# Patient Record
Sex: Female | Born: 1990 | Race: Black or African American | Hispanic: No | Marital: Single | Smoking: Never smoker
Health system: Southern US, Community
[De-identification: ages and names within clinical notes are randomized; demographics above are authoritative.]

## PROBLEM LIST (undated history)

## (undated) ENCOUNTER — Inpatient Hospital Stay: Payer: Self-pay

## (undated) DIAGNOSIS — D649 Anemia, unspecified: Secondary | ICD-10-CM

## (undated) DIAGNOSIS — L732 Hidradenitis suppurativa: Secondary | ICD-10-CM

## (undated) HISTORY — DX: Hidradenitis suppurativa: L73.2

## (undated) HISTORY — PX: OTHER SURGICAL HISTORY: SHX169

---
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
## 2011-10-24 ENCOUNTER — Emergency Department: Payer: Self-pay | Admitting: Emergency Medicine

## 2011-10-26 ENCOUNTER — Emergency Department: Payer: Self-pay | Admitting: Emergency Medicine

## 2012-10-19 ENCOUNTER — Emergency Department: Payer: Self-pay | Admitting: Emergency Medicine

## 2012-10-21 ENCOUNTER — Emergency Department: Payer: Self-pay | Admitting: Emergency Medicine

## 2012-11-26 ENCOUNTER — Emergency Department: Payer: Self-pay | Admitting: Emergency Medicine

## 2012-11-30 LAB — WOUND CULTURE

## 2013-09-22 ENCOUNTER — Emergency Department: Payer: Self-pay | Admitting: Emergency Medicine

## 2013-09-22 LAB — CBC
HCT: 36.7 % (ref 35.0–47.0)
HGB: 12.1 g/dL (ref 12.0–16.0)
MCH: 29.2 pg (ref 26.0–34.0)
MCHC: 32.9 g/dL (ref 32.0–36.0)
MCV: 89 fL (ref 80–100)
RBC: 4.13 10*6/uL (ref 3.80–5.20)
RDW: 14.2 % (ref 11.5–14.5)
WBC: 9.9 10*3/uL (ref 3.6–11.0)

## 2013-09-22 LAB — HCG, QUANTITATIVE, PREGNANCY: Beta Hcg, Quant.: <1 m[IU]/mL — ABNORMAL LOW

## 2013-11-03 ENCOUNTER — Emergency Department: Payer: Self-pay | Admitting: Emergency Medicine

## 2013-11-17 ENCOUNTER — Emergency Department: Payer: Self-pay | Admitting: Emergency Medicine

## 2013-12-07 ENCOUNTER — Observation Stay: Payer: Self-pay | Admitting: Surgery

## 2013-12-07 LAB — BASIC METABOLIC PANEL
ANION GAP: 7 (ref 7–16)
BUN: 6 mg/dL — ABNORMAL LOW (ref 7–18)
CHLORIDE: 101 mmol/L (ref 98–107)
Calcium, Total: 9.4 mg/dL (ref 8.5–10.1)
Co2: 25 mmol/L (ref 21–32)
Creatinine: 0.74 mg/dL (ref 0.60–1.30)
EGFR (African American): >60
EGFR (Non-African Amer.): >60
GLUCOSE: 109 mg/dL — AB (ref 65–99)
Osmolality: 265 (ref 275–301)
Potassium: 3.2 mmol/L — ABNORMAL LOW (ref 3.5–5.1)
Sodium: 133 mmol/L — ABNORMAL LOW (ref 136–145)

## 2013-12-07 LAB — CBC WITH DIFFERENTIAL/PLATELET
Basophil #: 0 10*3/uL (ref 0.0–0.1)
Basophil %: 0.2 %
EOS PCT: 0.5 %
Eosinophil #: 0.1 10*3/uL (ref 0.0–0.7)
HCT: 32.9 % — ABNORMAL LOW (ref 35.0–47.0)
HGB: 11.1 g/dL — AB (ref 12.0–16.0)
LYMPHS PCT: 4.2 %
Lymphocyte #: 0.7 10*3/uL — ABNORMAL LOW (ref 1.0–3.6)
MCH: 29.4 pg (ref 26.0–34.0)
MCHC: 33.7 g/dL (ref 32.0–36.0)
MCV: 87 fL (ref 80–100)
MONOS PCT: 3.9 %
Monocyte #: 0.6 x10 3/mm (ref 0.2–0.9)
NEUTROS ABS: 14.3 10*3/uL — AB (ref 1.4–6.5)
Neutrophil %: 91.2 %
PLATELETS: 336 10*3/uL (ref 150–440)
RBC: 3.77 10*6/uL — ABNORMAL LOW (ref 3.80–5.20)
RDW: 13.9 % (ref 11.5–14.5)
WBC: 15.7 10*3/uL — AB (ref 3.6–11.0)

## 2013-12-11 LAB — WOUND CULTURE

## 2013-12-26 DIAGNOSIS — L732 Hidradenitis suppurativa: Secondary | ICD-10-CM | POA: Insufficient documentation

## 2014-01-05 ENCOUNTER — Emergency Department: Payer: Self-pay | Admitting: Emergency Medicine

## 2014-01-05 LAB — BASIC METABOLIC PANEL
Anion Gap: 8 (ref 7–16)
BUN: 6 mg/dL — ABNORMAL LOW (ref 7–18)
CREATININE: 0.61 mg/dL (ref 0.60–1.30)
Calcium, Total: 9.2 mg/dL (ref 8.5–10.1)
Chloride: 103 mmol/L (ref 98–107)
Co2: 24 mmol/L (ref 21–32)
Glucose: 93 mg/dL (ref 65–99)
Osmolality: 267 (ref 275–301)
Potassium: 2.9 mmol/L — ABNORMAL LOW (ref 3.5–5.1)
Sodium: 135 mmol/L — ABNORMAL LOW (ref 136–145)

## 2014-01-05 LAB — URINALYSIS, COMPLETE
BILIRUBIN, UR: NEGATIVE
Blood: NEGATIVE
Glucose,UR: NEGATIVE mg/dL (ref 0–75)
Nitrite: NEGATIVE
Ph: 6 (ref 4.5–8.0)
SPECIFIC GRAVITY: 1.027 (ref 1.003–1.030)
WBC UR: 17 /HPF (ref 0–5)

## 2014-01-05 LAB — CBC
HCT: 34.8 % — AB (ref 35.0–47.0)
HGB: 11.5 g/dL — AB (ref 12.0–16.0)
MCH: 28.8 pg (ref 26.0–34.0)
MCHC: 33 g/dL (ref 32.0–36.0)
MCV: 87 fL (ref 80–100)
Platelet: 297 10*3/uL (ref 150–440)
RBC: 3.98 10*6/uL (ref 3.80–5.20)
RDW: 15 % — ABNORMAL HIGH (ref 11.5–14.5)
WBC: 7.3 10*3/uL (ref 3.6–11.0)

## 2014-01-13 ENCOUNTER — Emergency Department: Payer: Self-pay | Admitting: Emergency Medicine

## 2014-01-13 LAB — URINALYSIS, COMPLETE
BLOOD: NEGATIVE
Bilirubin,UR: NEGATIVE
Glucose,UR: NEGATIVE mg/dL (ref 0–75)
Nitrite: NEGATIVE
Ph: 6 (ref 4.5–8.0)
Protein: NEGATIVE
RBC,UR: 4 /HPF (ref 0–5)
Specific Gravity: 1.023 (ref 1.003–1.030)
Squamous Epithelial: 13
WBC UR: 21 /HPF (ref 0–5)

## 2014-01-13 LAB — COMPREHENSIVE METABOLIC PANEL
ALBUMIN: 3.3 g/dL — AB (ref 3.4–5.0)
ALT: 14 U/L (ref 12–78)
ANION GAP: 7 (ref 7–16)
AST: 12 U/L — AB (ref 15–37)
Alkaline Phosphatase: 62 U/L
BUN: 6 mg/dL — ABNORMAL LOW (ref 7–18)
Bilirubin,Total: 0.2 mg/dL (ref 0.2–1.0)
CALCIUM: 9.1 mg/dL (ref 8.5–10.1)
Chloride: 105 mmol/L (ref 98–107)
Co2: 25 mmol/L (ref 21–32)
Creatinine: 0.58 mg/dL — ABNORMAL LOW (ref 0.60–1.30)
EGFR (African American): >60
EGFR (Non-African Amer.): >60
Glucose: 73 mg/dL (ref 65–99)
Osmolality: 270 (ref 275–301)
Potassium: 3.3 mmol/L — ABNORMAL LOW (ref 3.5–5.1)
Sodium: 137 mmol/L (ref 136–145)
TOTAL PROTEIN: 7.5 g/dL (ref 6.4–8.2)

## 2014-01-13 LAB — CBC WITH DIFFERENTIAL/PLATELET
Basophil #: 0.1 10*3/uL (ref 0.0–0.1)
Basophil %: 0.6 %
Eosinophil #: 0.1 10*3/uL (ref 0.0–0.7)
Eosinophil %: 1.5 %
HCT: 33.8 % — ABNORMAL LOW (ref 35.0–47.0)
HGB: 11 g/dL — AB (ref 12.0–16.0)
Lymphocyte #: 1.6 10*3/uL (ref 1.0–3.6)
Lymphocyte %: 18.8 %
MCH: 28.4 pg (ref 26.0–34.0)
MCHC: 32.4 g/dL (ref 32.0–36.0)
MCV: 88 fL (ref 80–100)
Monocyte #: 0.5 x10 3/mm (ref 0.2–0.9)
Monocyte %: 6.2 %
NEUTROS ABS: 6.4 10*3/uL (ref 1.4–6.5)
Neutrophil %: 72.9 %
Platelet: 309 10*3/uL (ref 150–440)
RBC: 3.86 10*6/uL (ref 3.80–5.20)
RDW: 15 % — ABNORMAL HIGH (ref 11.5–14.5)
WBC: 8.7 10*3/uL (ref 3.6–11.0)

## 2014-01-13 LAB — HCG, QUANTITATIVE, PREGNANCY: BETA HCG, QUANT.: 17957 m[IU]/mL — AB

## 2014-05-16 ENCOUNTER — Ambulatory Visit: Payer: Self-pay | Admitting: Family Medicine

## 2014-05-27 ENCOUNTER — Observation Stay: Payer: Self-pay

## 2014-05-27 LAB — URINALYSIS, COMPLETE
BILIRUBIN, UR: NEGATIVE
BLOOD: NEGATIVE
Bacteria: NONE SEEN
Glucose,UR: NEGATIVE mg/dL (ref 0–75)
Ketone: NEGATIVE
Leukocyte Esterase: NEGATIVE
NITRITE: NEGATIVE
Ph: 7 (ref 4.5–8.0)
Protein: 30
RBC, UR: NONE SEEN /HPF (ref 0–5)
Specific Gravity: 1.021 (ref 1.003–1.030)
WBC UR: NONE SEEN /HPF (ref 0–5)

## 2014-05-28 LAB — CBC WITH DIFFERENTIAL/PLATELET
BASOS ABS: 0 10*3/uL (ref 0.0–0.1)
BASOS PCT: 0.2 %
EOS ABS: 0 10*3/uL (ref 0.0–0.7)
Eosinophil %: 0.6 %
HCT: 22.8 % — ABNORMAL LOW (ref 35.0–47.0)
HGB: 7.3 g/dL — AB (ref 12.0–16.0)
LYMPHS ABS: 1 10*3/uL (ref 1.0–3.6)
LYMPHS PCT: 14.5 %
MCH: 26.5 pg (ref 26.0–34.0)
MCHC: 31.8 g/dL — ABNORMAL LOW (ref 32.0–36.0)
MCV: 83 fL (ref 80–100)
Monocyte #: 0.8 x10 3/mm (ref 0.2–0.9)
Monocyte %: 11.2 %
NEUTROS ABS: 5.2 10*3/uL (ref 1.4–6.5)
NEUTROS PCT: 73.5 %
PLATELETS: 208 10*3/uL (ref 150–440)
RBC: 2.74 10*6/uL — AB (ref 3.80–5.20)
RDW: 15.7 % — AB (ref 11.5–14.5)
WBC: 7 10*3/uL (ref 3.6–11.0)

## 2014-05-28 LAB — COMPREHENSIVE METABOLIC PANEL
ALK PHOS: 129 U/L — AB
ANION GAP: 9 (ref 7–16)
Albumin: 2.2 g/dL — ABNORMAL LOW (ref 3.4–5.0)
BILIRUBIN TOTAL: 0.5 mg/dL (ref 0.2–1.0)
BUN: 4 mg/dL — ABNORMAL LOW (ref 7–18)
CHLORIDE: 108 mmol/L — AB (ref 98–107)
Calcium, Total: 7.8 mg/dL — ABNORMAL LOW (ref 8.5–10.1)
Co2: 24 mmol/L (ref 21–32)
Creatinine: 0.59 mg/dL — ABNORMAL LOW (ref 0.60–1.30)
Glucose: 86 mg/dL (ref 65–99)
Osmolality: 277 (ref 275–301)
POTASSIUM: 3.5 mmol/L (ref 3.5–5.1)
SGOT(AST): 13 U/L — ABNORMAL LOW (ref 15–37)
SGPT (ALT): 10 U/L — ABNORMAL LOW
SODIUM: 141 mmol/L (ref 136–145)
Total Protein: 6 g/dL — ABNORMAL LOW (ref 6.4–8.2)

## 2014-05-28 LAB — IRON AND TIBC
IRON: 47 ug/dL — AB (ref 50–170)
Iron Bind.Cap.(Total): 495 ug/dL — ABNORMAL HIGH (ref 250–450)
Iron Saturation: 9 %
Unbound Iron-Bind.Cap.: 448 ug/dL

## 2014-05-28 LAB — LIPASE, BLOOD: Lipase: 174 U/L (ref 73–393)

## 2014-05-30 ENCOUNTER — Observation Stay: Payer: Self-pay

## 2014-05-30 LAB — CBC WITH DIFFERENTIAL/PLATELET
Basophil #: 0 10*3/uL (ref 0.0–0.1)
Basophil %: 0.1 %
Eosinophil #: 0 10*3/uL (ref 0.0–0.7)
Eosinophil %: 0.2 %
HCT: 27.7 % — AB (ref 35.0–47.0)
HGB: 9.1 g/dL — ABNORMAL LOW (ref 12.0–16.0)
LYMPHS ABS: 1.3 10*3/uL (ref 1.0–3.6)
Lymphocyte %: 10.3 %
MCH: 27.2 pg (ref 26.0–34.0)
MCHC: 32.7 g/dL (ref 32.0–36.0)
MCV: 83 fL (ref 80–100)
Monocyte #: 0.6 x10 3/mm (ref 0.2–0.9)
Monocyte %: 4.5 %
Neutrophil #: 10.6 10*3/uL — ABNORMAL HIGH (ref 1.4–6.5)
Neutrophil %: 84.9 %
PLATELETS: 233 10*3/uL (ref 150–440)
RBC: 3.33 10*6/uL — ABNORMAL LOW (ref 3.80–5.20)
RDW: 15.9 % — ABNORMAL HIGH (ref 11.5–14.5)
WBC: 12.5 10*3/uL — AB (ref 3.6–11.0)

## 2014-05-30 LAB — URINALYSIS, COMPLETE
Bacteria: NONE SEEN
Bilirubin,UR: NEGATIVE
Blood: NEGATIVE
GLUCOSE, UR: NEGATIVE mg/dL (ref 0–75)
KETONE: NEGATIVE
LEUKOCYTE ESTERASE: NEGATIVE
Nitrite: NEGATIVE
Ph: 7 (ref 4.5–8.0)
Protein: NEGATIVE
RBC,UR: 4 /HPF (ref 0–5)
SPECIFIC GRAVITY: 1.016 (ref 1.003–1.030)
WBC UR: 3 /HPF (ref 0–5)

## 2014-06-16 ENCOUNTER — Ambulatory Visit: Payer: Self-pay | Admitting: Family Medicine

## 2014-06-22 ENCOUNTER — Observation Stay: Payer: Self-pay

## 2014-06-24 ENCOUNTER — Inpatient Hospital Stay: Payer: Self-pay

## 2014-06-24 LAB — CBC WITH DIFFERENTIAL/PLATELET
BASOS ABS: 0 10*3/uL (ref 0.0–0.1)
Basophil %: 0.2 %
Eosinophil #: 0.1 10*3/uL (ref 0.0–0.7)
Eosinophil %: 0.9 %
HCT: 29 % — ABNORMAL LOW (ref 35.0–47.0)
HGB: 9.2 g/dL — AB (ref 12.0–16.0)
LYMPHS PCT: 12.3 %
Lymphocyte #: 1.4 10*3/uL (ref 1.0–3.6)
MCH: 27.5 pg (ref 26.0–34.0)
MCHC: 31.6 g/dL — ABNORMAL LOW (ref 32.0–36.0)
MCV: 87 fL (ref 80–100)
MONO ABS: 0.5 x10 3/mm (ref 0.2–0.9)
Monocyte %: 4.4 %
Neutrophil #: 9.5 10*3/uL — ABNORMAL HIGH (ref 1.4–6.5)
Neutrophil %: 82.2 %
Platelet: 229 10*3/uL (ref 150–440)
RBC: 3.34 10*6/uL — AB (ref 3.80–5.20)
RDW: 19.4 % — ABNORMAL HIGH (ref 11.5–14.5)
WBC: 11.6 10*3/uL — ABNORMAL HIGH (ref 3.6–11.0)

## 2014-06-24 LAB — DRUG SCREEN, URINE
AMPHETAMINES, UR SCREEN: NEGATIVE (ref ?–1000)
BARBITURATES, UR SCREEN: NEGATIVE (ref ?–200)
Benzodiazepine, Ur Scrn: NEGATIVE (ref ?–200)
Cannabinoid 50 Ng, Ur ~~LOC~~: POSITIVE (ref ?–50)
Cocaine Metabolite,Ur ~~LOC~~: NEGATIVE (ref ?–300)
MDMA (ECSTASY) UR SCREEN: NEGATIVE (ref ?–500)
Methadone, Ur Screen: NEGATIVE (ref ?–300)
Opiate, Ur Screen: NEGATIVE (ref ?–300)
Phencyclidine (PCP) Ur S: NEGATIVE (ref ?–25)
Tricyclic, Ur Screen: NEGATIVE (ref ?–1000)

## 2014-06-25 LAB — GC/CHLAMYDIA PROBE AMP

## 2014-06-26 LAB — HEMATOCRIT: HCT: 27.6 % — AB (ref 35.0–47.0)

## 2014-11-16 HISTORY — PX: INCISION AND DRAINAGE BREAST ABSCESS: SUR672

## 2015-02-03 IMAGING — US US OB LIMITED
1 series · 14 of 28 positions shown · non-contrast
Comparison: none

CLINICAL DATA: Light vaginal bleeding.

EXAM:
LIMITED OBSTETRIC ULTRASOUND

[Series 1: us ob limited · 0.18mm/px · 36 acquisitions, 14 frames shown]
[im 2/36]
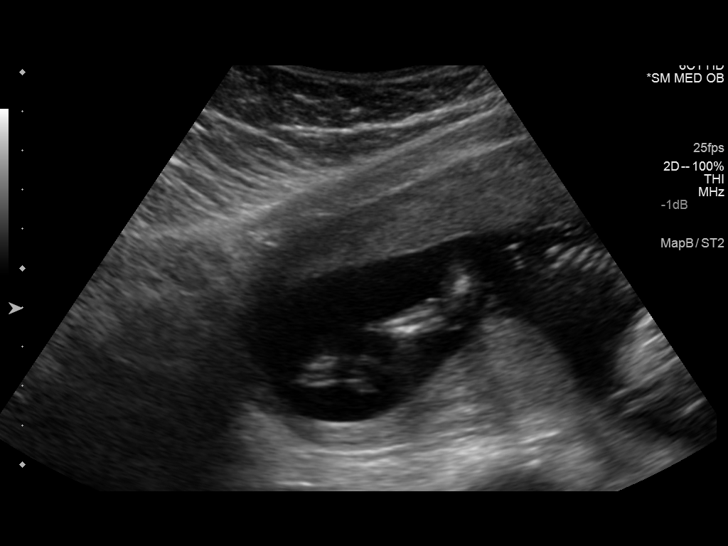
[im 4/36]
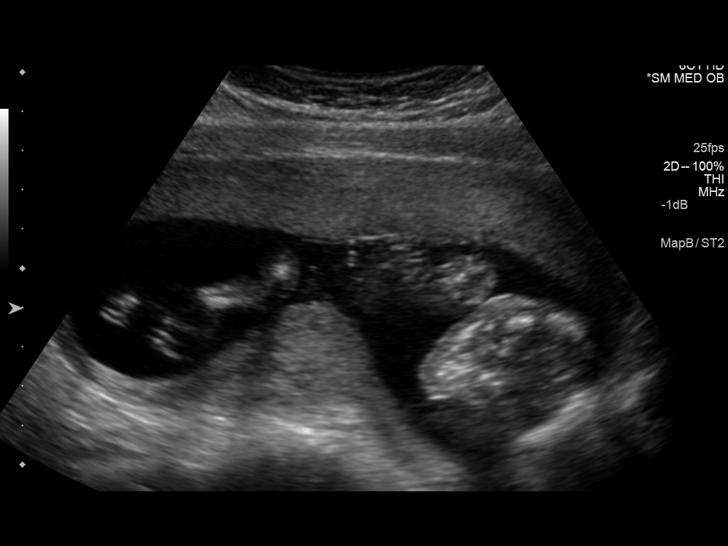
[im 7/36]
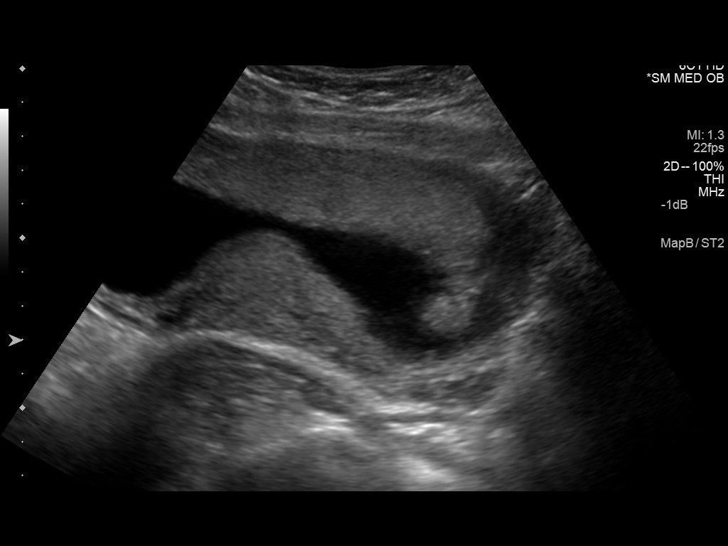
[im 10/36]
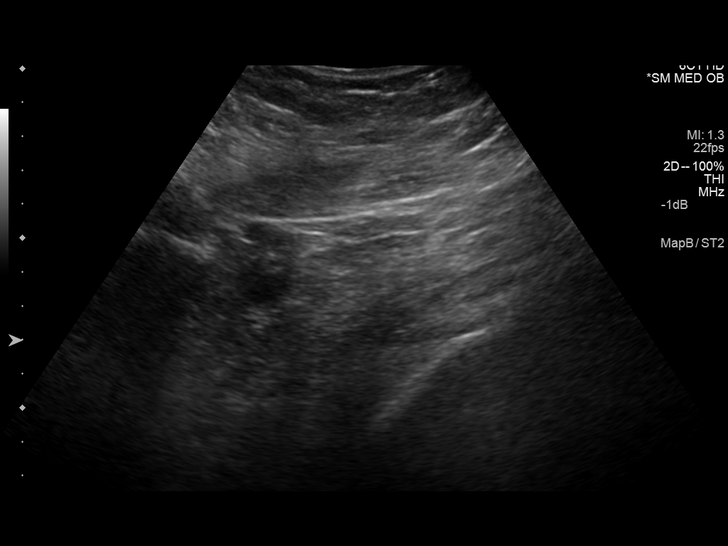
[im 12/36]
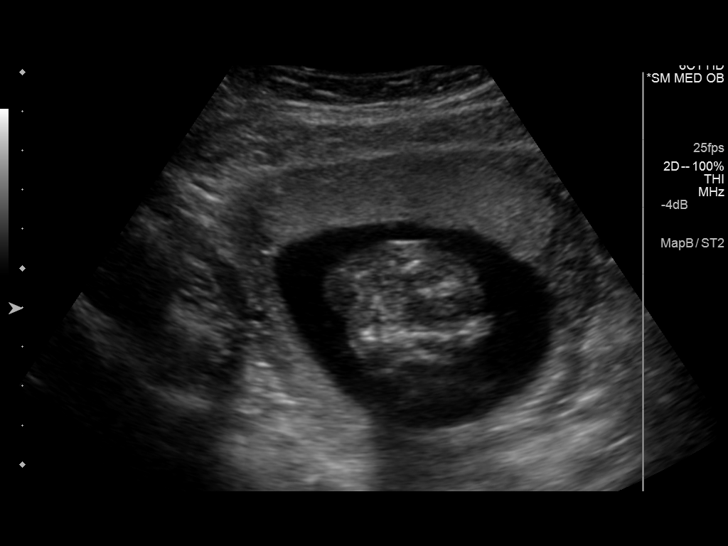
[im 15/36]
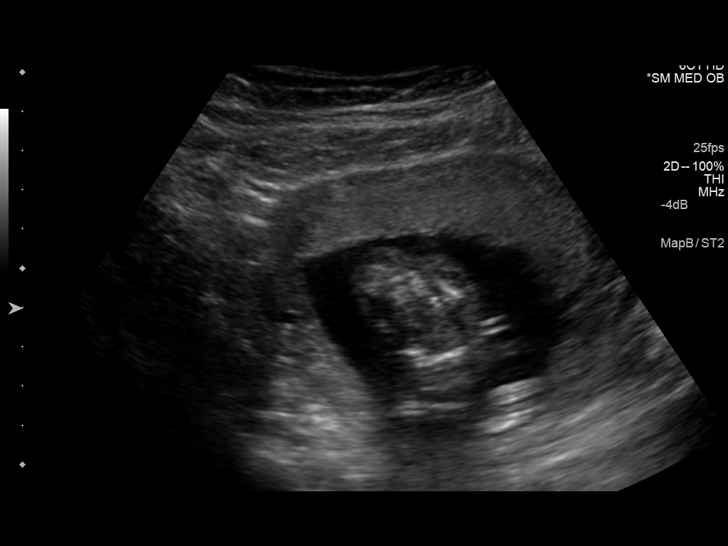
[im 17/36]
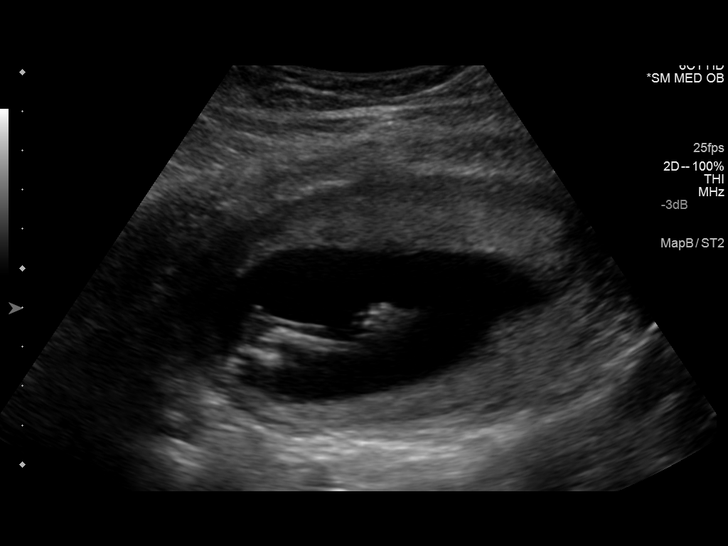
[im 20/36]
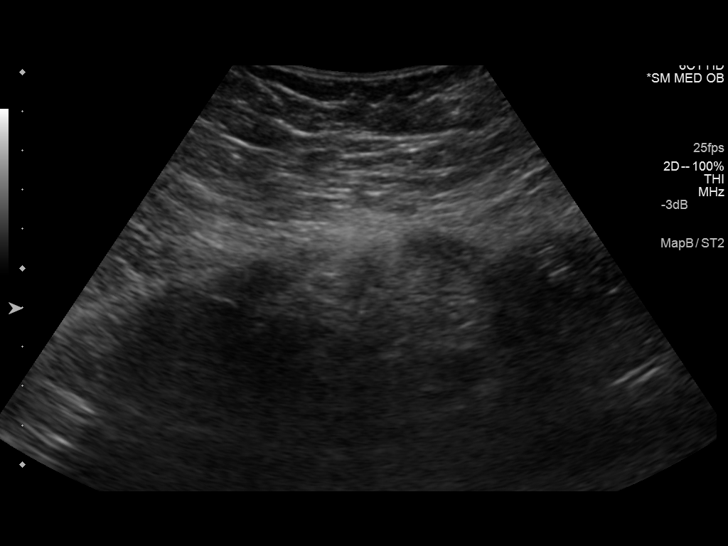
[im 23/36]
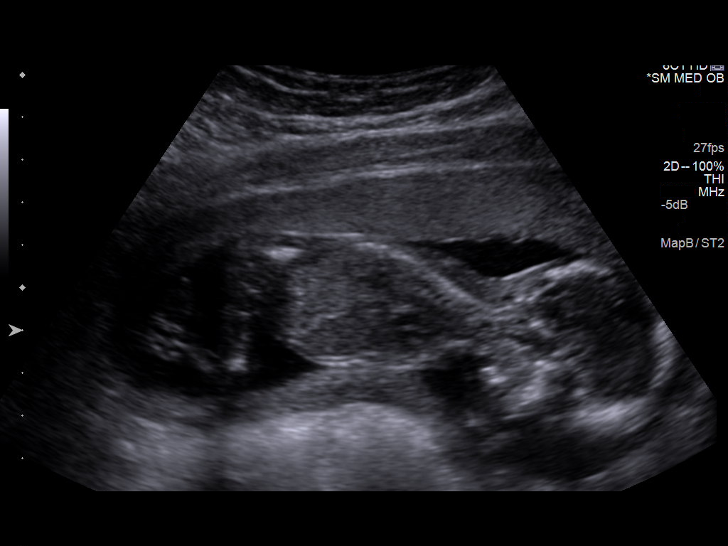
[im 25/36]
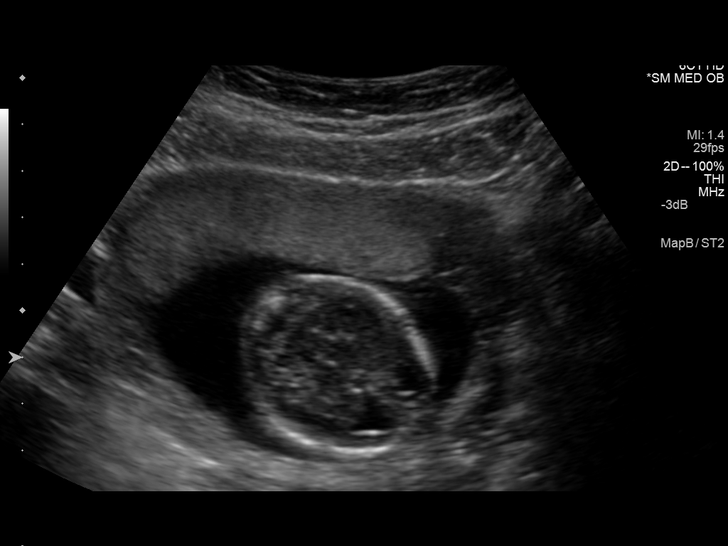
[im 28/36]
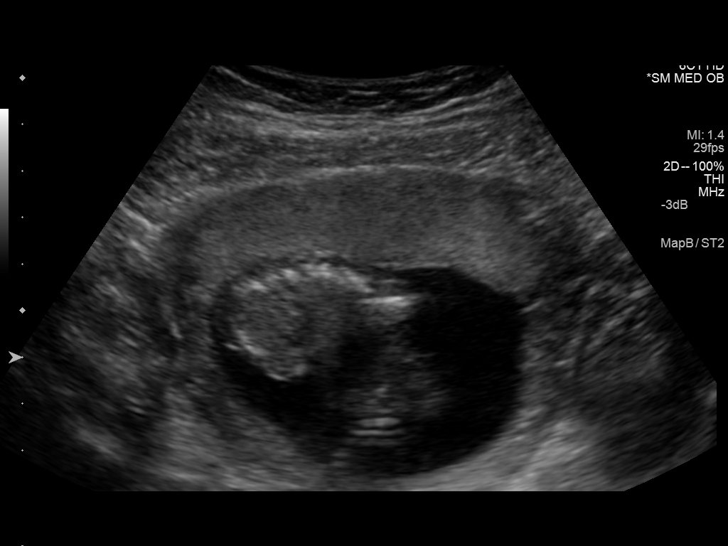
[im 30/36]
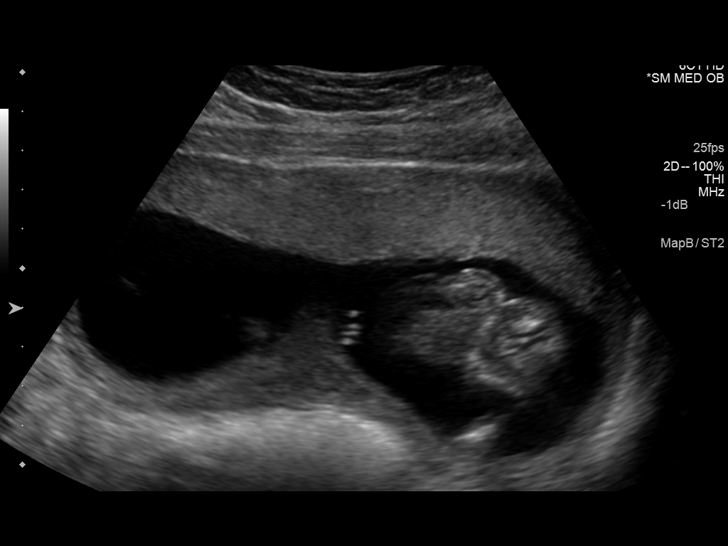
[im 33/36]
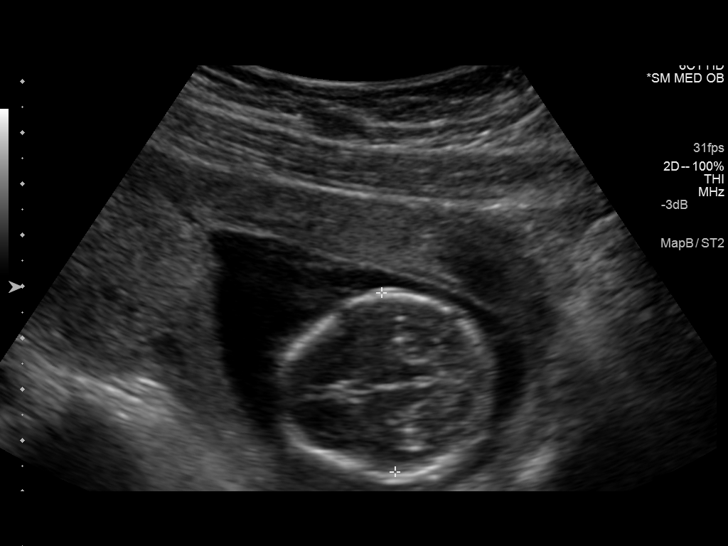
[im 36/36]
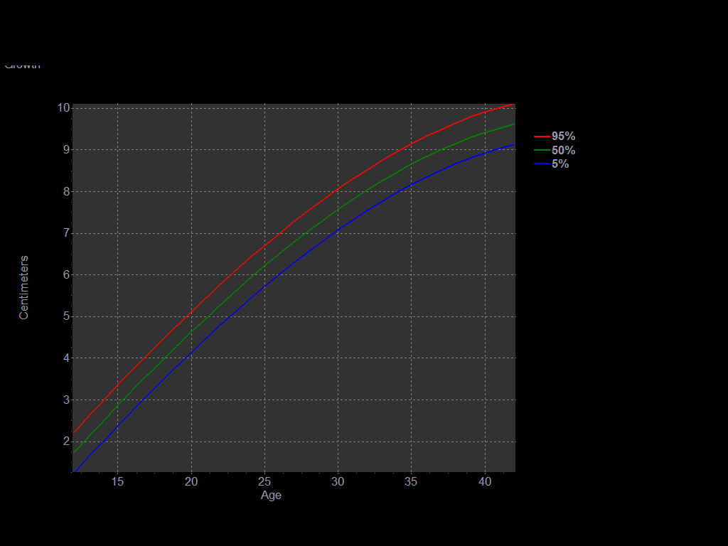

[14 of 28 positions shown; findings below may reference images not displayed]

FINDINGS: Number of Fetuses: 1

Heart Rate:  150 bpm

Movement: Yes

Presentation: Cephalic

Placental Location: Anterior

Previa: No

Amniotic Fluid (Subjective):  Within normal limits.

BPD:  3.49cm 16w  5d

MATERNAL FINDINGS:

Cervix:  Appears closed.

Uterus/Adnexae:  No abnormality visualized.
IMPRESSION: 1. Single viable IUP with estimated gestational age of 16 weeks and
5 days. Fetal heart rate is normal at 150 beats per min. Amniotic
fluid appears within normal limits. No acute findings.

This exam is performed on an emergent basis and does not
comprehensively evaluate fetal size, dating, or anatomy; follow-up
complete OB US should be considered if further fetal assessment is
warranted.

## 2015-02-06 NOTE — H&P (Signed)
   Subjective/Chief Complaint Left breast swelling/pain/erythema   History of Present Illness [name] is a pleasant 24 yo F who recently had both nipples pierced approx 2-3 weeks ago.  Took out rings then noticed increased pain/redness and swelling over the past 1 week.  Was given rx for bactrim but did not take as she has not eaten for 3 days.  + subjective fevers, malaise   Past Med/Surgical Hx:  Denies medical history:   Denies surgical history.:   ALLERGIES:  No Known Allergies:   Family and Social History:  Family History Cancer   Social History negative tobacco, negative ETOH   Place of Living [place]   Review of Systems:  Subjective/Chief Complaint Left breast swelling/redness/pain   Fever/Chills No   Cough No   Sputum No   Abdominal Pain No   Diarrhea No   Constipation No   Nausea/Vomiting Yes   SOB/DOE No   Chest Pain No   Dysuria No   Tolerating Diet No   Physical Exam:  GEN well developed, no acute distress   HEENT pink conjunctivae, PERRL, good dentition   RESP normal resp effort  clear BS   CARD regular rate  no murmur  no thrills   ABD denies tenderness  denies Flank Tenderness  positive hernia  soft  normal BS   SKIN Left breast with swelling, tenderness, erythema, approx 4 x 4 cm hard mass medial left breast   NEURO cranial nerves intact, negative rigidity, negative tremor, motor/sensory function intact   PSYCH A+O to time, place, person, good insight    Assessment/Admission Diagnosis [name] is a pleasant 24 yo F with left breast swelling/erythema/induration/pain.  Clinically I feel that she has a significant breast abscess.   Plan Admit, IV abx, plan on OR for aspiration vs I and D of breast abscess.   Electronic Signatures: Jarvis NewcomerLundquist, Ithan Touhey A (MD)  (Signed [phone]-Feb-15 15:10)  Authored: CHIEF COMPLAINT and HISTORY, PAST MEDICAL/SURGIAL HISTORY, ALLERGIES, FAMILY AND SOCIAL HISTORY, REVIEW OF SYSTEMS, PHYSICAL EXAM,  ASSESSMENT AND PLAN   Last Updated: 22-Feb-15 15:10 by Jarvis NewcomerLundquist, Alisabeth Selkirk A (MD)

## 2015-02-06 NOTE — Discharge Summary (Signed)
PATIENT NAME:  Joyce [name], Joyce A MR#:  [id number] DATE OF BIRTH:  06-17-1991  DATE OF ADMISSION:  12/07/2013 DATE OF DISCHARGE:  12/09/2013  FINAL DIAGNOSIS: Left breast abscess.   PROCEDURE PERFORMED: Left breast incision and drainage.   HOSPITAL COURSE SUMMARY: The patient was admitted on the 22nd. She was actually found to be pregnant. She was taken to the operating room on the 22nd of February and under local anesthesia with IV narcotics, the patient had incision and drainage of left breast abscess performed by Dr. Juliann PulseLundquist with Penrose drain placement. On postoperative day #1, the patient had some residual cellulitis and moderate drainage with adequate pain control. On postoperative day #2, the patient was doing much better and was deemed suitable for discharge with close followup in the office. Penrose drain will be left in place.   DISCHARGE MEDICATIONS: Cleocin 300 mg by mouth every 6 hours, Zofran 4 mg by mouth every 8 hours as needed for nausea and vomiting, acetaminophen/hydrocodone 5/325 one tab every 4 hours as needed for pain.   She was to follow up in the office this coming Friday for drain removal and followup.  ____________________________ Redge GainerMark A. Egbert GaribaldiBird, MD mab:jcm D: 12/22/2013 20:20:10 ET T: 12/22/2013 22:43:32 ET JOB#: [id number]  cc: Loraine LericheMark A. Egbert GaribaldiBird, MD, <Dictator> [place] Yearby A Cyera Balboni MD ELECTRONICALLY SIGNED 12/23/2013 0:40

## 2015-02-06 NOTE — Op Note (Signed)
PATIENT NAME:  Joyce [name], [name] MR#:  [id number] DATE OF BIRTH:  30-May-1991  DATE OF PROCEDURE:  06/25/2014  PREOPERATIVE DIAGNOSIS:  Term intrauterine pregnancy, failure to progress.   POSTOPERATIVE DIAGNOSIS:  Term intrauterine pregnancy, failure to progress.   PROCEDURE PERFORMED:  Low transverse cesarean section, placement of On-Q pain pump.   SURGEON:  Dierdre Searles. Paul Derius Ghosh, MD   ANESTHESIA:  Spinal.   ESTIMATED BLOOD LOSS:  250 mL.   COMPLICATIONS:  None.   FINDINGS:  Normal tubes, ovaries, and uterus; viable female infant weighing 9 pounds 2 ounces with Apgar scores of 8 and 9 at 1 and 5 minutes respectively.   DISPOSITION:  To the recovery room in stable condition.   TECHNIQUE:  The patient was prepped and draped in the usual sterile fashion after adequate anesthesia was obtained in the supine position on the operating room table. A scalpel was used to create a low transverse skin incision that was then dissected down to the level of the rectus fascia, which was dissected bilaterally using Mayo scissors. The rectus muscles were separated in the midline, the peritoneum was penetrated, and the bladder was inferiorly retracted and dissected. A scalpel was used to create a low transverse hysterotomy incision that was then extended by blunt dissection with amniotomy revealing clear fluid. The infant's head was grasped and delivered without complication, and no nuchal cord was noted. The infant was then delivered completely with clamping and cutting of the umbilical cord.   The placenta was extracted and the uterus was externalized and cleansed of all membranes and debris using a moist sponge. The hysterotomy incision was closed with running 1 Vicryl suture in a locking fashion followed by a second layer to imbricate the first layer with excellent hemostasis noted. The uterus was placed back in the intra-abdominal cavity, and the paracolic gutters were irrigated using warm saline. Re-examination of  the incision revealed excellent hemostasis. The peritoneum was closed with 0 Vicryl suture.   Trocars were placed in the abdomen into the subfascial space, where silver soaker catheters were placed without difficulty. The fascia was closed with 0 Maxon suture, subcutaneous tissues were irrigated, and hemostasis was assured using electrocautery. The skin was closed with a 4-0 Vicryl suture in a subcuticular fashion followed by placement of Dermabond. The catheters were flushed with 5 mL each of bupivacaine and stabilized in place with a Tegaderm bandage. The patient went to the recovery room in stable condition. All sponge, instrument, and needle counts were correct.    ____________________________ R. Annamarie MajorPaul Erricka Falkner, MD rph:nb D: 06/25/2014 22:08:48 ET T: 06/26/2014 00:04:09 ET JOB#: [id number]  cc: Dierdre Searles. Paul Tylan Briguglio, MD, <Dictator> Nadara MustardOBERT P Kassem Kibbe MD ELECTRONICALLY SIGNED 07/02/2014 13:03

## 2015-02-06 NOTE — Op Note (Signed)
PATIENT NAME:  Joyce [name], Joyce [name] MR#:  [id number] DATE OF BIRTH:  1991-07-24  DATE OF PROCEDURE:  12/07/2013  PREOPERATIVE DIAGNOSIS: Left breast abscess.  POSTOPERATIVE DIAGNOSIS:  Left breast abscess.  Measurement of total area 11 x 8 x 2 cm.   PROCEDURE PERFORMED: Incision and drainage of left breast abscess and placement of Penrose drain.   ANESTHESIA: IV narcotic and approximately 3 mL of 1% lidocaine.   ESTIMATED BLOOD LOSS:  10 mL   COMPLICATIONS: None.   INDICATION FOR SURGERY:  [name] is [name] pleasant 24 year old female who presents with left breast pain, swelling and erythema.  It was might thought that she had an abscess and was in need of surgical treatment due to the size of the abscess and the decision was made to perform it in operating room with IV narcotics.     DETAILS OF PROCEDURE: Informed consent was obtained.  [name] was brought to the operating room suite.  Of note, with informed consent, Joyce [name] was discovered to have [name] positive pregnancy test.  Her last menstrual cycle ended on December 6, so the thought was that she was approximately eight weeks gestation. Initial plan was for general anesthesia due to size. I still thought she would benefit from drainage but the decision was made to this almost exclusively on IV narcotics. She was then laid supine on the operating room table. She was given IV narcotics to provide her with some pain control. Her left breast was then prepped and draped in standard sterile fashion.  [name] timeout was then performed, correctly identifying the patient name, operative site and procedure to be performed. I then used approximately 3 mL of 1% lidocaine to infiltrate the skin above the most fluctuant area on her breast on her medial areola.  I then made [name] small incision.  There was [name] large amount purulence. I then irrigated the cavity and probed it with [name] hemostat. I made [name] counterincision inferiorly on the nipple and then placed [name] penrose  drain to keep the cavity open. I then packed with quarter-inch gauze to provide hemostasis.  Sterile dressing was then placed over the wound. The patient was then awoken, extubated and brought to the postanesthesia care unit. There were no immediate complications. Needle, sponge, and instrument count was correct at the end of the procedure.    ____________________________ Si Raiderhristopher [name]. Shiniqua Groseclose, MD cal:dp D: 12/09/2013 07:43:00 ET T: 12/09/2013 09:35:08 ET JOB#: [id number]  cc: Cristal Deerhristopher [name]. Taite Baldassari, MD, <Dictator> Jarvis NewcomerHRISTOPHER [name] Trinia Georgi MD ELECTRONICALLY SIGNED 12/10/2013 7:55

## 2015-02-23 NOTE — H&P (Signed)
L&D Evaluation:  History:  HPI 24 year old 684 51P1021 with EDC=06/17/2014  by a 16wk5day BPD presents at 37 weeks with c/o sudden onset of nausea/vomiting at 4 AM this morning, followed by diarrhea. Has vomited about 3 times since then, the last time around 12 noon. Has had 6 watery stools since then also. Denies blood in vomitus or stools. Woke up sweating, but unsure if she had a fever. Also c/o right side pain extending to right siide of lower back. Has had increased urinary frequency x 2 weeks, but no dysuria. Baby active. Has tried to eat applesauce and drink water today. Last ate and kept down [employer] at 7 PM last night. No one else who ate with her has had similar sx. [facility] late onset careat 20 weeks, depression (was on Zoloft for a short time and [facility]), a normal anatomy scan, and normal growth scan at 34 weeks (57th%), anemia, MJ use, and Trichimonas vaginitis. AB POS, VI, RI   Presents with back pain, nausea/vomiting, and diarrhea   Patient's Medical History Depression   Patient's Surgical History I&D left breast abscess   Medications Pre Natal Vitamins   Allergies NKDA   Social History drugs  MJ   Family History Non-Contributory   ROS:  ROS see HPI   Exam:  Vital Signs 126/67   Urine Protein not completed   General no apparent distress   Mental Status clear   Chest clear   Heart normal sinus rhythm, no murmur/gallop/rubs   Abdomen gravid, non-tender, BS active   Back possible right CVAT   FHT normal rate with no decels, 140s with accels to 160s to 170   FHT Description Cat 1   Ucx uterine irritability only   Skin dry   Other Mouth: thick sputum, no OP inflammation   Impression:  Impression reactive NST, IUP at 37 weeks with acute gastroenteritis   Plan:  Plan UA, IV fluids and IV antiemetics. NPO x sips water until 4 PM then try increasing clear liquids.   Electronic  Signatures: Trinna BalloonGutierrez, Jewelianna Pancoast L (CNM)  (Signed 12-Aug-15 14:33)  Authored: L&D Evaluation   Last Updated: 12-Aug-15 14:33 by Trinna BalloonGutierrez, Moneisha Vosler L (CNM)
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
## 2015-02-23 NOTE — H&P (Signed)
L&D Evaluation:  History:  HPI 24 year old G4 32P1021 with EDC=06/17/2014  by a 16wk5day BPD presents at 40 5/7 weeks for a NST and AFI. Baby has been active. Having some cramping and loose stools today, and a little nausea this AM. [facility] at 20 weeks, depression (was on Zoloft for a short time and was [facility]), a normal anatomy scan, and normal growth scan at 34 weeks (57th%), anemia (given a unit of PRBCs and IV Fe 3 weeks ago-is not taking Fe/vitamins due to constipation), MJ use, and Trichimonas vaginitis. AB POS, VI, RI, GBS positive. Received TDAP 04/15/2014   Presents with for NST/AFI   Patient's Medical History Depression   Patient's Surgical History I&D left breast abscess   Medications Pre Natal Vitamins  Iron  has not taken in a while   Allergies NKDA   Social History drugs  MJ   Family History Non-Contributory   ROS:  ROS see HPI   Exam:  Vital Signs stable  101/53   General no apparent distress   Mental Status clear   Chest clear   Heart normal sinus rhythm, no murmur/gallop/rubs   Abdomen gravid, non-tender   Estimated Fetal Weight 7-14   Fetal Position cephalic   Reflexes 1+   Pelvic no external lesions, 2/50%/-2   Mebranes Intact   FHT normal rate with no decels, 125 baseline with accels to 150s to 160. (Four 15x15 accels in 28 minutes   FHT Description Cat 1   Ucx q2-6 min, mild   Skin dry   Other AFI=1.10 cm + 2.10+ 5.64cm+3.47=12.3 cm   Impression:  Impression IUP at 40 5/7 weeks with reactive NST and  normal AFI   Plan:  Plan DC home with labor precautions   Comments Scheduled for IOL 9 Sept PM at 2000 if NIL. Discussed using Cervidil for ripening purposes Wednesday nite. Will also need GBS prophyllaxis.   Electronic Signatures: Trinna BalloonGutierrez, Hrishikesh Hoeg L (CNM)  (Signed 07-Sep-15 17:22)  Authored: L&D Evaluation   Last Updated: 07-Sep-15 17:22 by Trinna BalloonGutierrez,  Giabella Duhart L (CNM)

## 2015-02-25 ENCOUNTER — Emergency Department
Admission: EM | Admit: 2015-02-25 | Discharge: 2015-02-25 | Disposition: A | Discharge status: Home / Self Care | Payer: Medicaid Other | Attending: Emergency Medicine | Admitting: Emergency Medicine

## 2015-02-25 ENCOUNTER — Encounter: Payer: Self-pay | Admitting: *Deleted

## 2015-02-25 DIAGNOSIS — A400 Sepsis due to streptococcus, group A: Secondary | ICD-10-CM | POA: Diagnosis not present

## 2015-02-25 DIAGNOSIS — J020 Streptococcal pharyngitis: Secondary | ICD-10-CM | POA: Insufficient documentation

## 2015-02-25 DIAGNOSIS — D649 Anemia, unspecified: Secondary | ICD-10-CM | POA: Insufficient documentation

## 2015-02-25 DIAGNOSIS — J029 Acute pharyngitis, unspecified: Secondary | ICD-10-CM | POA: Diagnosis present

## 2015-02-25 HISTORY — DX: Anemia, unspecified: D64.9

## 2015-02-25 LAB — URINALYSIS COMPLETE WITH MICROSCOPIC (ARMC ONLY)
Bilirubin Urine: NEGATIVE
GLUCOSE, UA: NEGATIVE mg/dL
Leukocytes, UA: NEGATIVE
Nitrite: NEGATIVE
Protein, ur: 100 mg/dL — AB
SPECIFIC GRAVITY, URINE: 1.027 (ref 1.005–1.030)
pH: 6 (ref 5.0–8.0)

## 2015-02-25 LAB — CBC WITH DIFFERENTIAL/PLATELET
BASOS PCT: 0 %
Basophils Absolute: 0.1 10*3/uL (ref 0–0.1)
Eosinophils Absolute: 0 10*3/uL (ref 0–0.7)
Eosinophils Relative: 0 %
HEMATOCRIT: 37.8 % (ref 35.0–47.0)
HEMOGLOBIN: 12 g/dL (ref 12.0–16.0)
Lymphocytes Relative: 5 %
Lymphs Abs: 1.1 10*3/uL (ref 1.0–3.6)
MCH: 27.6 pg (ref 26.0–34.0)
MCHC: 31.8 g/dL — AB (ref 32.0–36.0)
MCV: 86.9 fL (ref 80.0–100.0)
MONOS PCT: 6 %
Monocytes Absolute: 1.1 10*3/uL — ABNORMAL HIGH (ref 0.2–0.9)
NEUTROS ABS: 17.9 10*3/uL — AB (ref 1.4–6.5)
NEUTROS PCT: 89 %
Platelets: 291 10*3/uL (ref 150–440)
RBC: 4.35 MIL/uL (ref 3.80–5.20)
RDW: 14.9 % — ABNORMAL HIGH (ref 11.5–14.5)
WBC: 20.2 10*3/uL — ABNORMAL HIGH (ref 3.6–11.0)

## 2015-02-25 LAB — BASIC METABOLIC PANEL
Anion gap: 10 (ref 5–15)
BUN: 6 mg/dL (ref 6–20)
CO2: 25 mmol/L (ref 22–32)
Calcium: 8.8 mg/dL — ABNORMAL LOW (ref 8.9–10.3)
Chloride: 103 mmol/L (ref 101–111)
Creatinine, Ser: 0.78 mg/dL (ref 0.44–1.00)
GFR calc Af Amer: >60 mL/min (ref >60)
Glucose, Bld: 98 mg/dL (ref 65–99)
Potassium: 3.5 mmol/L (ref 3.5–5.1)
Sodium: 138 mmol/L (ref 135–145)

## 2015-02-25 LAB — POCT RAPID STREP A: Streptococcus, Group A Screen (Direct): NEGATIVE

## 2015-02-25 MED ORDER — PENICILLIN G BENZATHINE 1200000 UNIT/2ML IM SUSP
1.2000 10*6.[IU] | Freq: Once | INTRAMUSCULAR | Status: AC
  Administered 2015-02-25: 1.2 10*6.[IU] via INTRAMUSCULAR
  Filled 2015-02-25: qty 2

## 2015-02-25 MED ORDER — ACETAMINOPHEN 500 MG PO TABS: 1000.0000 mg | ORAL_TABLET | Freq: Once | ORAL | Status: DC

## 2015-02-25 MED ORDER — ACETAMINOPHEN 500 MG PO TABS
ORAL_TABLET | ORAL | Status: AC
  Filled 2015-02-25: qty 3

## 2015-02-25 MED ORDER — PENICILLIN G PROCAINE 600000 UNIT/ML IM SUSP
INTRAMUSCULAR | Status: AC
  Administered 2015-02-25: 0.6 10*6.[IU]
  Filled 2015-02-25: qty 2

## 2015-02-25 MED ORDER — ONDANSETRON HCL 4 MG PO TABS: 4.0000 mg | ORAL_TABLET | Freq: Four times a day (QID) | ORAL | Status: DC | PRN

## 2015-02-25 MED ORDER — SODIUM CHLORIDE 0.9 % IV BOLUS (SEPSIS)
1000.0000 mL | Freq: Once | INTRAVENOUS | Status: AC
  Administered 2015-02-25: 1000 mL via INTRAVENOUS

## 2015-02-25 MED ORDER — ONDANSETRON HCL 4 MG/2ML IJ SOLN
INTRAMUSCULAR | Status: AC
  Administered 2015-02-25: 4 mg via INTRAVENOUS
  Filled 2015-02-25: qty 2

## 2015-02-25 MED ORDER — KETOROLAC TROMETHAMINE 30 MG/ML IJ SOLN
INTRAMUSCULAR | Status: AC
  Administered 2015-02-25: 30 mg
  Filled 2015-02-25: qty 6

## 2015-02-25 NOTE — ED Notes (Signed)
x3 days , sore throat, headache , earache, difficulty swallowing " because of the soreness"

## 2015-02-25 NOTE — Discharge Instructions (Signed)
Pharyngitis Pharyngitis is redness, pain, and swelling (inflammation) of your pharynx.  CAUSES  Pharyngitis is usually caused by infection. Most of the time, these infections are from viruses (viral) and are part of a cold. However, sometimes pharyngitis is caused by bacteria (bacterial). Pharyngitis can also be caused by allergies. Viral pharyngitis may be spread from person to person by coughing, sneezing, and personal items or utensils (cups, forks, spoons, toothbrushes). Bacterial pharyngitis may be spread from person to person by more intimate contact, such as kissing.  SIGNS AND SYMPTOMS  Symptoms of pharyngitis include:   Sore throat.   Tiredness (fatigue).   Low-grade fever.   Headache.  Joint pain and muscle aches.  Skin rashes.  Swollen lymph nodes.  Plaque-like film on throat or tonsils (often seen with bacterial pharyngitis). DIAGNOSIS  Your health care provider will ask you questions about your illness and your symptoms. Your medical history, along with a physical exam, is often all that is needed to diagnose pharyngitis. Sometimes, a rapid strep test is done. Other lab tests may also be done, depending on the suspected cause.  TREATMENT  Viral pharyngitis will usually get better in 3-4 days without the use of medicine. Bacterial pharyngitis is treated with medicines that kill germs (antibiotics).  HOME CARE INSTRUCTIONS   Drink enough water and fluids to keep your urine clear or pale yellow.   Only take over-the-counter or prescription medicines as directed by your health care provider:   If you are prescribed antibiotics, make sure you finish them even if you start to feel better.   Do not take aspirin.   Get lots of rest.   Gargle with 8 oz of salt water ( tsp of salt per 1 qt of water) as often as every 1-2 hours to soothe your throat.   Throat lozenges (if you are not at risk for choking) or sprays may be used to soothe your throat. SEEK MEDICAL  CARE IF:   You have large, tender lumps in your neck.  You have a rash.  You cough up green, yellow-brown, or bloody spit. SEEK IMMEDIATE MEDICAL CARE IF:   Your neck becomes stiff.  You drool or are unable to swallow liquids.  You vomit or are unable to keep medicines or liquids down.  You have severe pain that does not go away with the use of recommended medicines.  You have trouble breathing (not caused by a stuffy nose). MAKE SURE YOU:   Understand these instructions.  Will watch your condition.  Will get help right away if you are not doing well or get worse. Document Released: 10/02/2005 Document Revised: 07/23/2013 Document Reviewed: 06/09/2013 [facility] Patient Information 2015 [place]. This information is not intended to replace advice given to you by your health care provider. Make sure you discuss any questions you have with your health care provider.  You have been treated for an acute infection in the throat which seems to have caused you to become septic. Sepsis includes your symptoms of fever, chills, nausea, weakness, and dehydration. Although your rapid strep test was negative, you have been treated with the expectation that your throat culture will be positive. You may receive a call with that result tomorrow.  Rest, hydrate, and take Tylenol and Motrin for pain and fevers. Take the medicine for nausea as needed, increase fluid intake to prevent dehydration.  Follow-up with your provider, or return to the ED immediately for worsening fevers, nausea, pain, or decreased urination.

## 2015-02-25 NOTE — ED Notes (Signed)
States she is having h/a   Sore throat and body aches for several days.

## 2015-02-25 NOTE — ED Provider Notes (Signed)
[facility] Emergency Department Provider Note? ____________________________________________ ? Time seen: 1332 ? I have reviewed the triage vital signs and the nursing notes. ________ HISTORY ? Chief Complaint Influenza  HPI  Joyce [name] is a 24 y.o. female patient reports to the ED with concern for possible influenza. She states onset Monday, about 3 days prior to arrival, sore throat, nausea, vomiting, headache, and bilateral earaches. She also notes body aches and poor appetite over the last 2 days. She has had intermittent fevers, and chills as well as lethargy. She denies any bad food, recent travel, or sick contacts in the interim. She gives a history of recurrent strep infections as a child, and reports this feels like strep throat.  Review of Systems  Constitutional: Positive for fever, chills, sweats, and fatigue. Eyes: Negative for visual changes. ENT: Positive for sore throat, and earaches. Cardiovascular: Negative for chest pain. Respiratory: Negative for shortness of breath. Gastrointestinal: Negative for abdominal pain, constipation, or diarrhea. Positive for nausea & vomiting. Musculoskeletal: Negative for back pain. Skin: Negative for rash. Neurological: Negative for focal weakness or numbness. Positive for headaches. LMP: now  10-point ROS otherwise negative. ____________________________________________  Past Medical History  Diagnosis Date  . Anemia    Patient Active Problem List   Diagnosis Date Noted  . Anemia   ? Past Surgical History  Procedure Laterality Date  . Other surgical history N/A     had c section, blood transfusion  ? Current Outpatient Rx  Name  Route  Sig  Dispense  Refill  . ondansetron (ZOFRAN) 4 MG tablet   Oral   Take 1 tablet (4 mg total) by mouth every 6 (six) hours as needed for nausea or vomiting.   15 tablet   0   ? Allergies Review of patient's allergies indicates no known allergies. ? No  family history on file. ? Social History History  Substance Use Topics  . Smoking status: Never Smoker   . Smokeless tobacco: Not on file  . Alcohol Use: No   PHYSICAL EXAM:  VITAL SIGNS: ED Triage Vitals  Enc Vitals Group     BP 02/25/15 1239 118/72 mmHg     Pulse Rate 02/25/15 1239 127     Resp --      Temp 02/25/15 1239 100 F (37.8 C)     Temp Source 02/25/15 1239 Oral     SpO2 02/25/15 1239 97 %     Weight 02/25/15 1239 153 lb (69.4 kg)     Height 02/25/15 1239 5\' 1"  (1.549 m)     Head Cir --      Peak Flow --      Pain Score 02/25/15 1240 9     Pain Loc --      Pain Edu? --      Excl. in GC? --    Constitutional: Alert and oriented. Well appearing and in no distress. Eyes: Conjunctivae are normal. PERRL. Normal extraocular movements. ENT   Head: Normocephalic and atraumatic.   Nose: No congestion/rhinnorhea.   Mouth/Throat: Mucous membranes are moist. Posterior pharynx with erythema, and tonsilar swelling noted.      Ears: Normal external exam. Canals clear. TMs retracted, injected, with serous fluid noted bilaterally.   Neck: Supple. Tender anterior, upper cervical lymphadenopathy. Cardiovascular: Normal rate, regular rhythm. Normal and symmetric distal pulses are present in all extremities. No murmurs, rubs, or gallops. Respiratory: Normal respiratory effort without tachypnea nor retractions. Breath sounds are clear and equal bilaterally. No  wheezes/rales/rhonchi. Gastrointestinal: Soft and nontender.  Musculoskeletal: Normal range of motion in all extremities.  Neurologic:  Normal speech and language. No gross focal neurologic deficits are appreciated.  Skin:  Skin is warm, dry and intact. No rash noted. Psychiatric: Mood and affect are normal. Patient exhibits appropriate insight and judgment. _____________ PROCEDURES    Sepsis protocols initiated.   Normal saline 1000 mg bolus x 2  Blood cultures x 2  Zofran 4 mg IVP x 1  Toradol 30 mg IVP x  1  Bicillin LA 1.2 million units IM x 1   ______________________________________________________ INITIAL IMPRESSION / ASSESSMENT AND PLAN / ED COURSE ? Lab results to patient.  Empiric treatment for strep pharyngitis and acute sepsis based on clinical presentation, history, and exam; despite negative rapid strep test. Fluid resuscitation and nausea and pain control provided.  Patient able to tolerate oral fluids in the ED during course.  Reports decreased pain & swelling in throat. Patient advised on reasons to return to the ED.   Pertinent labs & imaging results that were available during my care of the patient were reviewed by me and considered in my medical decision making (see chart for details). ____________________________________________ FINAL CLINICAL IMPRESSION(S) / ED DIAGNOSES?  Final diagnoses:  Acute streptococcal pharyngitis  Sepsis due to group A Streptococcus      Joyce HoardJenise V Bacon Joyce Giuliani, PA-C 02/25/15 1855  Darien Ramusavid W Kaminski, MD 03/02/15 [phone]

## 2015-02-27 LAB — CULTURE, GROUP A STREP (THRC): SPECIAL REQUESTS: NORMAL

## 2015-03-02 LAB — CULTURE, BLOOD (ROUTINE X 2)
Culture: NO GROWTH
Culture: NO GROWTH
Special Requests: NORMAL
Special Requests: NORMAL

## 2015-03-03 ENCOUNTER — Emergency Department
Admission: EM | Admit: 2015-03-03 | Discharge: 2015-03-03 | Disposition: A | Discharge status: Home / Self Care | Payer: Medicaid Other | Attending: Emergency Medicine | Admitting: Emergency Medicine

## 2015-03-03 ENCOUNTER — Emergency Department: Payer: Medicaid Other

## 2015-03-03 DIAGNOSIS — Z3202 Encounter for pregnancy test, result negative: Secondary | ICD-10-CM | POA: Insufficient documentation

## 2015-03-03 DIAGNOSIS — H9201 Otalgia, right ear: Secondary | ICD-10-CM | POA: Insufficient documentation

## 2015-03-03 DIAGNOSIS — N390 Urinary tract infection, site not specified: Secondary | ICD-10-CM | POA: Diagnosis not present

## 2015-03-03 DIAGNOSIS — J029 Acute pharyngitis, unspecified: Secondary | ICD-10-CM | POA: Diagnosis not present

## 2015-03-03 LAB — CBC WITH DIFFERENTIAL/PLATELET
Basophils Absolute: 0.1 10*3/uL (ref 0–0.1)
Basophils Relative: 1 %
Eosinophils Absolute: 0.2 10*3/uL (ref 0–0.7)
Eosinophils Relative: 1 %
HCT: 36.4 % (ref 35.0–47.0)
Hemoglobin: 11.6 g/dL — ABNORMAL LOW (ref 12.0–16.0)
LYMPHS ABS: 2 10*3/uL (ref 1.0–3.6)
Lymphocytes Relative: 11 %
MCH: 27.5 pg (ref 26.0–34.0)
MCHC: 32 g/dL (ref 32.0–36.0)
MCV: 86.1 fL (ref 80.0–100.0)
Monocytes Absolute: 1 10*3/uL — ABNORMAL HIGH (ref 0.2–0.9)
Monocytes Relative: 6 %
Neutro Abs: 15.1 10*3/uL — ABNORMAL HIGH (ref 1.4–6.5)
Neutrophils Relative %: 81 %
PLATELETS: 364 10*3/uL (ref 150–440)
RBC: 4.22 MIL/uL (ref 3.80–5.20)
RDW: 15.2 % — ABNORMAL HIGH (ref 11.5–14.5)
WBC: 18.3 10*3/uL — ABNORMAL HIGH (ref 3.6–11.0)

## 2015-03-03 LAB — COMPREHENSIVE METABOLIC PANEL
ALT: 12 U/L — ABNORMAL LOW (ref 14–54)
ANION GAP: 12 (ref 5–15)
AST: 13 U/L — ABNORMAL LOW (ref 15–41)
Albumin: 3.7 g/dL (ref 3.5–5.0)
Alkaline Phosphatase: 77 U/L (ref 38–126)
BUN: 6 mg/dL (ref 6–20)
CALCIUM: 8.8 mg/dL — AB (ref 8.9–10.3)
CO2: 26 mmol/L (ref 22–32)
Chloride: 105 mmol/L (ref 101–111)
Creatinine, Ser: 0.8 mg/dL (ref 0.44–1.00)
GLUCOSE: 96 mg/dL (ref 65–99)
Potassium: 3 mmol/L — ABNORMAL LOW (ref 3.5–5.1)
SODIUM: 143 mmol/L (ref 135–145)
Total Bilirubin: 0.4 mg/dL (ref 0.3–1.2)
Total Protein: 7.5 g/dL (ref 6.5–8.1)

## 2015-03-03 LAB — URINALYSIS COMPLETE WITH MICROSCOPIC (ARMC ONLY)
Bilirubin Urine: NEGATIVE
Glucose, UA: NEGATIVE mg/dL
KETONES UR: NEGATIVE mg/dL
NITRITE: NEGATIVE
PROTEIN: NEGATIVE mg/dL
Specific Gravity, Urine: 1.004 — ABNORMAL LOW (ref 1.005–1.030)
pH: 6 (ref 5.0–8.0)

## 2015-03-03 LAB — POCT PREGNANCY, URINE: PREG TEST UR: NEGATIVE

## 2015-03-03 LAB — POCT RAPID STREP A: Streptococcus, Group A Screen (Direct): NEGATIVE

## 2015-03-03 MED ORDER — DEXAMETHASONE SODIUM PHOSPHATE 10 MG/ML IJ SOLN
INTRAMUSCULAR | Status: AC
  Filled 2015-03-03: qty 1

## 2015-03-03 MED ORDER — DEXAMETHASONE SODIUM PHOSPHATE 10 MG/ML IJ SOLN
10.0000 mg | Freq: Once | INTRAMUSCULAR | Status: AC
  Administered 2015-03-03: 10 mg via INTRAVENOUS

## 2015-03-03 MED ORDER — SODIUM CHLORIDE 0.9 % IV SOLN
Freq: Once | INTRAVENOUS | Status: AC
  Administered 2015-03-03: 09:00:00 via INTRAVENOUS

## 2015-03-03 MED ORDER — CEPHALEXIN 500 MG PO CAPS: 500.0000 mg | ORAL_CAPSULE | Freq: Four times a day (QID) | ORAL | Status: AC

## 2015-03-03 NOTE — ED Provider Notes (Addendum)
[facility] Emergency Department Provider Note ____________________________________________  Time seen: Approximately 8:48 AM  I have reviewed the triage vital signs and the nursing notes.   HISTORY  Chief Complaint Sore Throat and Otalgia   HPI Joyce [name] is a 24 y.o. female presents to the ER for complaints of sore throat and right ear pain. Patient states she was seen in ER 1 week ago for sore throat and reports that she is signed better. Patient states she is no longer having fevers. However reports of the sore throat has never fully resolved. Patient states pain and sore throat is rated as a 6 out of 10 scratchy and achy. Patient states that she continues to drink fluids well however not eating much.  Reports when in ER last week she was given antibiotic shot to treat her strep throat.  Denies fever, chest pain, shortness of breath, abdominal pain, nausea, vomiting, diarrhea.   Past Medical History  Diagnosis Date  . Anemia     Patient Active Problem List   Diagnosis Date Noted  . Anemia     Past Surgical History  Procedure Laterality Date  . Other surgical history N/A     had c section, blood transfusion    Current Outpatient Rx  Name  Route  Sig  Dispense  Refill  . ondansetron (ZOFRAN) 4 MG tablet   Oral   Take 1 tablet (4 mg total) by mouth every 6 (six) hours as needed for nausea or vomiting.   15 tablet   0    Notes and results reviewed from patient ER visit on 02/25/2015.at that time patient was seen for sore throat showing to be tachycardic, febrile, with a WBC of 20. Patient was treated in ER with Bicillin injection. Patient was stabilized in ER and had reported to be feeling better and was discharged.   Allergies Review of patient's allergies indicates no known allergies.  History reviewed. No pertinent family history.  Social History History  Substance Use Topics  . Smoking status: Never Smoker   . Smokeless  tobacco: Not on file  . Alcohol Use: No    Review of Systems Constitutional: No fever/chills Eyes: No visual changes. ENT: positive for sore throat and right ear pain. Cardiovascular: Denies chest pain. Respiratory: Denies shortness of breath. Gastrointestinal: No abdominal pain.  No nausea, no vomiting.  No diarrhea.  No constipation. Genitourinary: Negative for dysuria. Musculoskeletal: Negative for back pain. Skin: Negative for rash. Neurological: Negative for headaches, focal weakness or numbness.  10-point ROS otherwise negative.  ____________________________________________   PHYSICAL EXAM:  VITAL SIGNS: ED Triage Vitals  Enc Vitals Group     BP 03/03/15 0807 120/63 mmHg     Pulse Rate 03/03/15 0807 85     Resp 03/03/15 0807 16     Temp 03/03/15 0807 98.1 F (36.7 C)     Temp Source 03/03/15 0807 Oral     SpO2 03/03/15 0807 100 %     Weight 03/03/15 0807 163 lb (73.936 kg)     Height 03/03/15 0807 5\' 1"  (1.549 m)     Head Cir --      Peak Flow --      Pain Score 03/03/15 0808 8     Pain Loc --      Pain Edu? --      Excl. in GC? --     Constitutional: Alert and oriented. Well appearing and in no acute distress. Eyes: Conjunctivae are normal. PERRL. EOMI.  Head: Atraumatic. Nose: No congestion/rhinnorhea. Mouth/Throat: Mucous membranes are moist.  Mild bilateral tonsillar swelling with erythema. No exudate. No uvular shift or deviation. Neck: No stridor.  No cervical spine tenderness to palpation.  Hematological/Lymphatic/Immunilogical: mild anterior cervical lymphadenopathy. Cardiovascular: Normal rate, regular rhythm. Grossly normal heart sounds.  Good peripheral circulation. Respiratory: Normal respiratory effort.  No retractions. Lungs CTAB. Gastrointestinal: Soft and nontender. No distention. No abdominal bruits. No CVA tenderness. }Musculoskeletal: No lower extremity tenderness nor edema.  No joint effusions. Neurologic:  Normal speech and language. No  gross focal neurologic deficits are appreciated. Speech is normal. No gait instability. Skin:  Skin is warm, dry and intact. No rash noted. Psychiatric: Mood and affect are normal. Speech and behavior are normal.  ____________________________________________   LABS (all labs ordered are listed, but only abnormal results are displayed)  Labs Reviewed  CBC WITH DIFFERENTIAL/PLATELET - Abnormal; Notable for the following:    WBC 18.3 (*)    Hemoglobin 11.6 (*)    RDW 15.2 (*)    Neutro Abs 15.1 (*)    Monocytes Absolute 1.0 (*)    All other components within normal limits  COMPREHENSIVE METABOLIC PANEL - Abnormal; Notable for the following:    Potassium 3.0 (*)    Calcium 8.8 (*)    AST 13 (*)    ALT 12 (*)    All other components within normal limits  URINALYSIS COMPLETEWITH MICROSCOPIC ([facility])  - Abnormal; Notable for the following:    Color, Urine YELLOW (*)    APPearance CLEAR (*)    Specific Gravity, Urine 1.004 (*)    Hgb urine dipstick 1+ (*)    Leukocytes, UA TRACE (*)    Bacteria, UA RARE (*)    Squamous Epithelial / LPF 6-30 (*)    All other components within normal limits  URINE CULTURE  POC URINE PREG, ED  POCT RAPID STREP A ([facility]RG CARE ONLY)  POCT PREGNANCY, URINE  POCT RAPID STREP A ([facility] CARE ONLY)    RADIOLOGY  CHEST 2 VIEW  COMPARISON: 11/26/2012.  FINDINGS: The heart size and mediastinal contours are within normal limits. Both lungs are clear. The visualized skeletal structures are unremarkable.  IMPRESSION: No active cardiopulmonary disease.   Electronically Signed By: Davonna Belling M.D. On: 03/03/2015 09:57 ____________________________________________     INITIAL IMPRESSION / ASSESSMENT AND PLAN / ED COURSE  Pertinent labs & imaging results that were available during my care of the patient were reviewed by me and considered in my medical decision making (see chart for details).  No acute distress. Very well-appearing  patient. Patient tolerating oral fluids in ER.reports feels better. Patient recently seen and evaluated in ER and treated for strep throat with one-time dose penicillin. Patient continues with sore throat complaints. No signs of tonsillar abscess. Vital signs stable. Patient with urinary tract infection. Afebrile and abdomen soft and nontender. No other signs of infection.  Will treat patient with oral cephalexin at home to cover her urinary tract infection and potential continued strep throat. Strict follow-up and return parameters given to patient. Patient to follow-up with ear nose and throat tomorrow. Patient agreed to plan.  Patient also seen and evaluated by Dr. Inocencio Homes who also agrees to treatment plan. ____________________________________________   FINAL CLINICAL IMPRESSION(S) / ED DIAGNOSES  Final diagnoses:  UTI (lower urinary tract infection)  Pharyngitis     Renford Dills, NP 03/03/15 1056  Gayla Doss, MD 03/03/15 1101  Patient with persistent elevation in white blood cell  count however this is downtrending on chart review. Reviewed blood cultures which showed no growth from 02/25/2015. Clinically she appears very well. Vital signs normalized with normal saline bolus. No evidence of PTA. Agree with discharge, antibiotics, and ENT follow-up.  Gayla DossEryka A Roshaun Pound, MD 03/03/15 [phone]

## 2015-03-03 NOTE — Discharge Instructions (Signed)
Take medication as prescribed. Take over-the-counter Tylenol or ibuprofen as needed. Rest. Drink plenty of fluids  Follow-up with ear nose and throat tomorrow here in see above to call today to schedule.  Follow-up the primary care physician next week.  Return to the ER for new or worsening concerns.  Pharyngitis Pharyngitis is redness, pain, and swelling (inflammation) of your pharynx.  CAUSES  Pharyngitis is usually caused by infection. Most of the time, these infections are from viruses (viral) and are part of a cold. However, sometimes pharyngitis is caused by bacteria (bacterial). Pharyngitis can also be caused by allergies. Viral pharyngitis may be spread from person to person by coughing, sneezing, and personal items or utensils (cups, forks, spoons, toothbrushes). Bacterial pharyngitis may be spread from person to person by more intimate contact, such as kissing.  SIGNS AND SYMPTOMS  Symptoms of pharyngitis include:   Sore throat.   Tiredness (fatigue).   Low-grade fever.   Headache.  Joint pain and muscle aches.  Skin rashes.  Swollen lymph nodes.  Plaque-like film on throat or tonsils (often seen with bacterial pharyngitis). DIAGNOSIS  Your health care provider will ask you questions about your illness and your symptoms. Your medical history, along with a physical exam, is often all that is needed to diagnose pharyngitis. Sometimes, a rapid strep test is done. Other lab tests may also be done, depending on the suspected cause.  TREATMENT  Viral pharyngitis will usually get better in 3-4 days without the use of medicine. Bacterial pharyngitis is treated with medicines that kill germs (antibiotics).  HOME CARE INSTRUCTIONS   Drink enough water and fluids to keep your urine clear or pale yellow.   Only take over-the-counter or prescription medicines as directed by your health care provider:   If you are prescribed antibiotics, make sure you finish them even if you  start to feel better.   Do not take aspirin.   Get lots of rest.   Gargle with 8 oz of salt water ( tsp of salt per 1 qt of water) as often as every 1-2 hours to soothe your throat.   Throat lozenges (if you are not at risk for choking) or sprays may be used to soothe your throat. SEEK MEDICAL CARE IF:   You have large, tender lumps in your neck.  You have a rash.  You cough up green, yellow-brown, or bloody spit. SEEK IMMEDIATE MEDICAL CARE IF:   Your neck becomes stiff.  You drool or are unable to swallow liquids.  You vomit or are unable to keep medicines or liquids down.  You have severe pain that does not go away with the use of recommended medicines.  You have trouble breathing (not caused by a stuffy nose). MAKE SURE YOU:   Understand these instructions.  Will watch your condition.  Will get help right away if you are not doing well or get worse. Document Released: 10/02/2005 Document Revised: 07/23/2013 Document Reviewed: 06/09/2013 [facility] Patient Information 2015 [employer] [place]. This information is not intended to replace advice given to you by your health care provider. Make sure you discuss any questions you have with your health care provider.  Pharyngitis Pharyngitis is a sore throat (pharynx). There is redness, pain, and swelling of your throat. HOME CARE   Drink enough fluids to keep your pee (urine) clear or pale yellow.  Only take medicine as told by your doctor.  You may get sick again if you do not take medicine as told. Finish your  medicines, even if you start to feel better.  Do not take aspirin.  Rest.  Rinse your mouth (gargle) with salt water ( tsp of salt per 1 qt of water) every 1-2 hours. This will help the pain.  If you are not at risk for choking, you can suck on hard candy or sore throat lozenges. GET HELP IF:  You have large, tender lumps on your neck.  You have a rash.  You cough up green, yellow-brown, or bloody  spit. GET HELP RIGHT AWAY IF:   You have a stiff neck.  You drool or cannot swallow liquids.  You throw up (vomit) or are not able to keep medicine or liquids down.  You have very bad pain that does not go away with medicine.  You have problems breathing (not from a stuffy nose). MAKE SURE YOU:   Understand these instructions.  Will watch your condition.  Will get help right away if you are not doing well or get worse. Document Released: 03/20/2008 Document Revised: 07/23/2013 Document Reviewed: 06/09/2013 [facility] Patient Information 2015 [place] [place]. This information is not intended to replace advice given to you by your health care provider. Make sure you discuss any questions you have with your health care provider.

## 2015-03-03 NOTE — ED Notes (Signed)
Pt c/o sore throat to right side, right ear pain. Denies fever. Pt alert and oriented X4, active, cooperative, pt in NAD. RR even and unlabored, color WNL.

## 2015-03-05 LAB — CULTURE, GROUP A STREP (THRC)

## 2015-03-05 LAB — URINE CULTURE

## 2015-08-25 ENCOUNTER — Emergency Department
Admission: EM | Admit: 2015-08-25 | Discharge: 2015-08-25 | Disposition: A | Discharge status: Home / Self Care | Payer: Medicaid Other | Attending: Emergency Medicine | Admitting: Emergency Medicine

## 2015-08-25 DIAGNOSIS — L02411 Cutaneous abscess of right axilla: Secondary | ICD-10-CM | POA: Diagnosis not present

## 2015-08-25 DIAGNOSIS — L089 Local infection of the skin and subcutaneous tissue, unspecified: Secondary | ICD-10-CM | POA: Diagnosis present

## 2015-08-25 DIAGNOSIS — L732 Hidradenitis suppurativa: Secondary | ICD-10-CM | POA: Diagnosis not present

## 2015-08-25 DIAGNOSIS — Z0189 Encounter for other specified special examinations: Secondary | ICD-10-CM

## 2015-08-25 DIAGNOSIS — L02412 Cutaneous abscess of left axilla: Secondary | ICD-10-CM | POA: Diagnosis not present

## 2015-08-25 DIAGNOSIS — L02419 Cutaneous abscess of limb, unspecified: Secondary | ICD-10-CM

## 2015-08-25 DIAGNOSIS — Z7689 Persons encountering health services in other specified circumstances: Secondary | ICD-10-CM

## 2015-08-25 MED ORDER — DOXYCYCLINE HYCLATE 100 MG PO TABS
100.0000 mg | ORAL_TABLET | Freq: Once | ORAL | Status: AC
  Administered 2015-08-25: 100 mg via ORAL
  Filled 2015-08-25: qty 1

## 2015-08-25 MED ORDER — DOXYCYCLINE HYCLATE 100 MG PO TABS: 100.0000 mg | ORAL_TABLET | Freq: Two times a day (BID) | ORAL | Status: DC

## 2015-08-25 MED ORDER — HYDROCODONE-ACETAMINOPHEN 5-325 MG PO TABS: 1.0000 | ORAL_TABLET | Freq: Three times a day (TID) | ORAL | Status: DC | PRN

## 2015-08-25 MED ORDER — LIDOCAINE-EPINEPHRINE (PF) 1 %-1:200000 IJ SOLN
30.0000 mL | Freq: Once | INTRAMUSCULAR | Status: DC
  Filled 2015-08-25: qty 30

## 2015-08-25 NOTE — ED Notes (Signed)
Has some abcess under left arm pit

## 2015-08-25 NOTE — ED Notes (Signed)
dsy also to right arm pit

## 2015-08-25 NOTE — ED Provider Notes (Signed)
[facility] Emergency Department Provider Note ____________________________________________  Time seen: 1432  I have reviewed the triage vital signs and the nursing notes.  HISTORY  Chief Complaint  Recurrent Skin Infections  HPI Joyce [name] is a 24 y.o. female reports to the ED for evaluation of abscesses to her armpits. She notes onset about 2 days prior, and no spontaneous drainage from both sides. She reports that the right side is slightly more tender and swollen than the left. She denies any interim fever, chills, or sweats. She does admit to a history of confirmed hidradenitis suppurativa, but has never been followed up with surgery for definitive measures. She is here for evaluation of spontaneously draining abscesses to the bilateral armpits. She rates her pain a 9/10 in triage.  Past Medical History  Diagnosis Date  . Anemia     Patient Active Problem List   Diagnosis Date Noted  . Anemia     Past Surgical History  Procedure Laterality Date  . Other surgical history N/A     had c section, blood transfusion    Current Outpatient Rx  Name  Route  Sig  Dispense  Refill  . doxycycline (VIBRA-TABS) 100 MG tablet   Oral   Take 1 tablet (100 mg total) by mouth 2 (two) times daily.   90 tablet   0   . HYDROcodone-acetaminophen (NORCO) 5-325 MG tablet   Oral   Take 1 tablet by mouth every 8 (eight) hours as needed for moderate pain.   15 tablet   0   . ondansetron (ZOFRAN) 4 MG tablet   Oral   Take 1 tablet (4 mg total) by mouth every 6 (six) hours as needed for nausea or vomiting.   15 tablet   0    Allergies Review of patient's allergies indicates no known allergies.  History reviewed. No pertinent family history.  Social History Social History  Substance Use Topics  . Smoking status: Never Smoker   . Smokeless tobacco: None  . Alcohol Use: No   Review of Systems  Constitutional: Negative for fever. Eyes: Negative for  visual changes. ENT: Negative for sore throat. Cardiovascular: Negative for chest pain. Respiratory: Negative for shortness of breath. Gastrointestinal: Negative for abdominal pain, vomiting and diarrhea. Genitourinary: Negative for dysuria. Musculoskeletal: Negative for back pain. Skin: Negative for rash. Abscess as above.  Neurological: Negative for headaches, focal weakness or numbness. ____________________________________________  PHYSICAL EXAM:  VITAL SIGNS: ED Triage Vitals  Enc Vitals Group     BP 08/25/15 1347 113/63 mmHg     Pulse Rate 08/25/15 1347 104     Resp 08/25/15 1347 16     Temp 08/25/15 1347 98.3 F (36.8 C)     Temp Source 08/25/15 1347 Oral     SpO2 08/25/15 1347 96 %     Weight 08/25/15 1347 167 lb (75.751 kg)     Height 08/25/15 1347 5\' 1"  (1.549 m)     Head Cir --      Peak Flow --      Pain Score 08/25/15 1335 9     Pain Loc --      Pain Edu? --      Excl. in GC? --    Constitutional: Alert and oriented. Well appearing and in no distress. Head: Normocephalic and atraumatic.      Eyes: Conjunctivae are normal. PERRL. Normal extraocular movements      Ears: Canals clear. TMs intact bilaterally.   Nose: No  congestion/rhinorrhea.   Mouth/Throat: Mucous membranes are moist.   Neck: Supple. No thyromegaly. Hematological/Lymphatic/Immunological: No cervical lymphadenopathy. Cardiovascular: Normal rate, regular rhythm.  Respiratory: Normal respiratory effort. No wheezes/rales/rhonchi. Gastrointestinal: Soft and nontender. No distention. Musculoskeletal: Nontender with normal range of motion in all extremities.  Neurologic:  Normal gait without ataxia. Normal speech and language. No gross focal neurologic deficits are appreciated. Skin:  Skin is warm, dry and intact. No rash noted. Chronic scarring & skin changes to the bilateral axilla consistent with HS. Minimal discharge noted.  Psychiatric: Mood and affect are normal. Patient exhibits  appropriate insight and judgment. ____________________________________________  PROCEDURES  Doxycycline 100 mg PO  INCISION AND DRAINAGE Performed by: Lissa Hoard Consent: Verbal consent obtained. Risks and benefits: risks, benefits and alternatives were discussed Type: abscess  Body area: bilateral axilla   Anesthesia: local infiltration  Incision was made with a scalpel.  Local anesthetic: lidocaine 1% w/ epinephrine  Anesthetic total: 10 ml  Complexity: complex Blunt dissection to break up loculations  Drainage: purulent  Drainage amount: 4 ml  Packing material: 1/4 in iodoform gauze  Patient tolerance: Patient tolerated the procedure well with no immediate complications. ____________________________________________  INITIAL IMPRESSION / ASSESSMENT AND PLAN / ED COURSE  Patient with acute abscess and drainage secondary to hidradenitis suppurativa of the bilateral axilla. She is status post local I&D with minimal discharge. She will be provided with a prescription for long-term doxycycline, which is recommended for HS. She will be referred to surgery for consultation. Follow-up with [facility] as needed. Return as needed for wound check. Remove packing in 2-3 days and apply warm compresses to promote healing. Norco #15 provided for acute pain relief. ____________________________________________  FINAL CLINICAL IMPRESSION(S) / ED DIAGNOSES  Final diagnoses:  Hidradenitis suppurativa of right axilla  Hidradenitis suppurativa of left axilla  Abscess of axilla  Encounter for incision and drainage procedure      Lissa Hoard, PA-C 08/25/15 1740  Richardean Canal, MD 08/26/15 [phone]

## 2015-08-25 NOTE — Discharge Instructions (Signed)
Hidradenitis Suppurativa Hidradenitis suppurativa is a long-term (chronic) skin disease that starts with blocked sweat glands or hair follicles. Bacteria may grow in these blocked openings of your skin. Hidradenitis suppurativa is like a severe form of acne that develops in areas of your body where acne would be unusual. It is most likely to affect the areas of your body where skin rubs against skin and becomes moist. This includes your:  Underarms.  Groin.  Genital areas.  Buttocks.  Upper thighs.  Breasts. Hidradenitis suppurativa may start out with small pimples. The pimples can develop into deep sores that break open (rupture) and drain pus. Over time your skin may thicken and become scarred. Hidradenitis suppurativa cannot be passed from person to person.  CAUSES  The exact cause of hidradenitis suppurativa is not known. This condition may be due to:  Female and female hormones. The condition is rare before and after puberty.  An overactive body defense system (immune system). Your immune system may overreact to the blocked hair follicles or sweat glands and cause swelling and pus-filled sores. RISK FACTORS You may have a higher risk of hidradenitis suppurativa if you:  Are a woman.  Are between ages 11 and 9.  Have a family history of hidradenitis suppurativa.  Have a personal history of acne.  Are overweight.  Smoke.  Take the drug lithium. SIGNS AND SYMPTOMS  The first signs of an outbreak are usually painful skin bumps that look like pimples. As the condition progresses:  Skin bumps may get bigger and grow deeper into the skin.  Bumps under the skin may rupture and drain smelly pus.  Skin may become itchy and infected.  Skin may thicken and scar.  Drainage may continue through tunnels under the skin (fistulas).  Walking and moving your arms can become painful. DIAGNOSIS  Your health care provider may diagnose hidradenitis suppurativa based on your medical  history and your signs and symptoms. A physical exam will also be done. You may need to see a health care provider who specializes in skin diseases (dermatologist). You may also have tests done to confirm the diagnosis. These can include:  Swabbing a sample of pus or drainage from your skin so it can be sent to the lab and tested for infection.  Blood tests to check for infection. TREATMENT  The same treatment will not work for everybody with hidradenitis suppurativa. Your treatment will depend on how severe your symptoms are. You may need to try several treatments to find what works best for you. Part of your treatment may include cleaning and bandaging (dressing) your wounds. You may also have to take medicines, such as the following:  Antibiotics.  Acne medicines.  Medicines to block or suppress the immune system.  A diabetes medicine (metformin) is sometimes used to treat this condition.  For women, birth control pills can sometimes help relieve symptoms. You may need surgery if you have a severe case of hidradenitis suppurativa that does not respond to medicine. Surgery may involve:   Using a laser to clear the skin and remove hair follicles.  Opening and draining deep sores.  Removing the areas of skin that are diseased and scarred. HOME CARE INSTRUCTIONS  Learn as much as you can about your disease, and work closely with your health care providers.  Take medicines only as directed by your health care provider.  If you were prescribed an antibiotic medicine, finish it all even if you start to feel better.  If you are  overweight, losing weight may be very helpful. Try to reach and maintain a healthy weight.  Do not use any tobacco products, including cigarettes, chewing tobacco, or electronic cigarettes. If you need help quitting, ask your health care provider.  Do not shave the areas where you get hidradenitis suppurativa.  Do not wear deodorant.  Wear loose-fitting  clothes.  Try not to overheat and get sweaty.  Take a daily bleach bath as directed by your health care provider.  Fill your bathtub halfway with water.  Pour in  cup of unscented household bleach.  Soak for 5-10 minutes.  Cover sore areas with a warm, clean washcloth (compress) for 5-10 minutes. SEEK MEDICAL CARE IF:   You have a flare-up of hidradenitis suppurativa.  You have chills or a fever.  You are having trouble controlling your symptoms at home.   This information is not intended to replace advice given to you by your health care provider. Make sure you discuss any questions you have with your health care provider.   Document Released: 05/16/2004 Document Revised: 10/23/2014 Document Reviewed: 01/02/2014 Elsevier Interactive Patient Education 2016 [employer]  Incision and Drainage Incision and drainage is a procedure in which a sac-like structure (cystic structure) is opened and drained. The area to be drained usually contains material such as pus, fluid, or blood.  LET YOUR CAREGIVER KNOW ABOUT:   Allergies to medicine.  Medicines taken, including vitamins, herbs, eyedrops, over-the-counter medicines, and creams.  Use of steroids (by mouth or creams).  Previous problems with anesthetics or numbing medicines.  History of bleeding problems or blood clots.  Previous surgery.  Other health problems, including diabetes and kidney problems.  Possibility of pregnancy, if this applies. RISKS AND COMPLICATIONS  Pain.  Bleeding.  Scarring.  Infection. BEFORE THE PROCEDURE  You may need to have an ultrasound or other imaging tests to see how large or deep your cystic structure is. Blood tests may also be used to determine if you have an infection or how severe the infection is. You may need to have a tetanus shot. PROCEDURE  The affected area is cleaned with a cleaning fluid. The cyst area will then be numbed with a medicine (local anesthetic). A small  incision will be made in the cystic structure. A syringe or catheter may be used to drain the contents of the cystic structure, or the contents may be squeezed out. The area will then be flushed with a cleansing solution. After cleansing the area, it is often gently packed with a gauze or another wound dressing. Once it is packed, it will be covered with gauze and tape or some other type of wound dressing. AFTER THE PROCEDURE   Often, you will be allowed to go home right after the procedure.  You may be given antibiotic medicine to prevent or heal an infection.  If the area was packed with gauze or some other wound dressing, you will likely need to come back in 1 to 2 days to get it removed.  The area should heal in about 14 days.   This information is not intended to replace advice given to you by your health care provider. Make sure you discuss any questions you have with your health care provider.   Document Released: 03/28/2001 Document Revised: 04/02/2012 Document Reviewed: 11/27/2011 Elsevier Interactive Patient Education [employer]   Keep the wounds clean, dry, and covered. Consider use of spray antiperspirants. Take the antibiotic as directed until completely gone.  Follow-up with  Dr. Juliann PulseLundquist for surgical evaluation. Return as needed for wound check. Return in 2-3 days for packing removal, if needed.

## 2015-08-25 NOTE — ED Notes (Signed)
Gauze dsy to i& d site to left arm pit

## 2015-08-25 NOTE — ED Notes (Signed)
Patient comes in with abscess under left arm that was noticed yesterday.  Patient reports site being very tender to touch.

## 2015-08-25 NOTE — ED Notes (Signed)
Lidocaine [place] to [place], [place] to administer to patient.

## 2015-08-31 ENCOUNTER — Encounter: Payer: Self-pay | Admitting: General Surgery

## 2015-08-31 ENCOUNTER — Ambulatory Visit (INDEPENDENT_AMBULATORY_CARE_PROVIDER_SITE_OTHER): Payer: Medicaid Other | Admitting: General Surgery

## 2015-08-31 VITALS — BP 115/60 | HR 67 | Temp 98.3°F | Ht 61.0 in | Wt 185.0 lb

## 2015-08-31 DIAGNOSIS — L732 Hidradenitis suppurativa: Secondary | ICD-10-CM | POA: Diagnosis not present

## 2015-08-31 MED ORDER — CLINDAMYCIN PHOSPHATE 1 % EX SOLN: CUTANEOUS | Status: AC

## 2015-08-31 NOTE — Patient Instructions (Addendum)
Please try to keep your axillas dry and clean. If your axillas become inflamed, please do not "pop" them. This will cause it to get infected. Try not to shave your axillas. Let's keep an eye on them over the holidays. By the beginning of the year give us a call and schedule an appointment for us to schedule the appointment. But, if you need us before then, please give us a call.

## 2015-08-31 NOTE — Progress Notes (Signed)
Patient ID: Joyce MillerKeyonna A Quesnell, female   DOB: 05-06-1991, 24 y.o.   MRN: [id number]  CC: Hidradenitis  HPI Joyce [name] is a 24 y.o. female presents to clinic for evaluation of inflamed areas underneath bilateral axilla. Patient states that she's had these recurrently over many years. She says that they've required lancing approximately 3 times over the last 2 years area and these always been done in the emergency department. She's also been on oral antibiotics 5 or 6 times over that time. She was most recently seen last week had the area under her right arm lanced in the emergency department. She denies any fevers, chills, nausea, vomiting, chest pain, shortness of breath, diarrhea, constipation. She states she was evaluated when she was pregnant about a year and a half ago and the areas were much worsening [place] [facility] [place] [facility] and was offered a wide excision. Due to her condition and unforeseen circumstances she was unable to follow-up with that surgeon. She is here today to see what further treatments are recommended and needed.  HPI  Past Medical History  Diagnosis Date  . Anemia   . Hidradenitis axillaris     Past Surgical History  Procedure Laterality Date  . Other surgical history N/A     had c section, blood transfusion  . Incision and drainage breast abscess Left 11/2014    Family History  Problem Relation Age of Onset  . Migraines Mother   . Stroke Maternal Grandmother   . Cancer Maternal Grandfather 70    throat  . Cancer Paternal Grandmother     Social History Social History  Substance Use Topics  . Smoking status: Never Smoker   . Smokeless tobacco: None  . Alcohol Use: 0.0 oz/week    0 Standard drinks or equivalent per week     Comment: occa    No Known Allergies  Current Outpatient Prescriptions  Medication Sig Dispense Refill  . doxycycline (VIBRA-TABS) 100 MG tablet Take 1 tablet (100 mg total) by mouth 2 (two) times daily. 90 tablet 0   . JUNEL FE 1/20 1-20 MG-MCG tablet Take 1 tablet by mouth 1 day or 1 dose.  11  . clindamycin (CLEOCIN-T) 1 % external solution Apply to affected area 3 times daily 60 mL 3   No current facility-administered medications for this visit.     Review of Systems A multipoint review of systems was completed. All pertinent positives negatives within the history of present illness the remainder were negative.  Physical Exam Blood pressure 115/60, pulse 67, temperature 98.3 F (36.8 C), temperature source Oral, height 5\' 1"  (1.549 m), weight 83.915 kg (185 lb), last menstrual period 08/17/2015. CONSTITUTIONAL: Appropriate and in no acute distress. EYES: Pupils are equal, round, and reactive to light, Sclera are non-icteric. EARS, NOSE, MOUTH AND THROAT: The oropharynx is clear. The oral mucosa is pink and moist. Hearing is intact to voice. LYMPH NODES:  Lymph nodes in the neck are normal. RESPIRATORY:  Lungs are clear. There is normal respiratory effort, with equal breath sounds bilaterally, and without pathologic use of accessory muscles. CARDIOVASCULAR: Heart is regular without murmurs, gallops, or rubs. GI: The abdomen is  soft, nontender, and nondistended. There are no palpable masses. There is no hepatosplenomegaly. There are normal bowel sounds in all quadrants. GU: Rectal deferred.   MUSCULOSKELETAL: Normal muscle strength and tone. No cyanosis or edema.   SKIN: Multiple raised areas on the bilateral axilla consistent with hidradenitis. No active drainage  or evidence of abscess from either underarm. There are 2 visible and palpable tracts underneath the right arm and 3 under the left. The largest of these 5 is under the right arm and is approximately 1 x 2 cm NEUROLOGIC: Motor and sensation is grossly normal. Cranial nerves are grossly intact. PSYCH:  Oriented to person, place and time. Affect is normal.  Data Reviewed No labs her images to review today. I have personally reviewed the  patient's imaging, laboratory findings and medical records.    Assessment    24 year old female with bilateral axillary hidradenitis    Plan    And long conversation with the patient about the staging and treatment of hidradenitis. Provided her with a handout today for the basics of self-care of flares of hidradenitis and discussed that she is likely a Hurley grade 2. She is currently on oral doxycycline that was prescribed by the emergency department last week. We will add topical clindamycin to her current treatment. She desires a trial time. To care for this at home with the antibiotics prior to pursuing any more aggressive therapies. Discussed that should this not improve or worsen within all for her either a local unroofing or an operative excision of these areas. Plan to see her back in clinic after the first of the year.     Time spent with the patient was 30 minutes, with more than 50% of the time spent in face-to-face education, counseling and care coordination.     Ricarda Frame 08/31/2015, 9:28 AM

## 2015-09-01 ENCOUNTER — Ambulatory Visit: Payer: Self-pay | Admitting: General Surgery

## 2016-03-31 IMAGING — CR DG CHEST 2V
1 series · 2 of 2 positions shown · non-contrast
Comparison: 11/26/2012.

CLINICAL DATA: LEFT ear pain and throat pain. Patient was
hospitalized several days ago for flu-like symptoms.

EXAM:
CHEST  2 VIEW

[Series 1: dg chest 2 view · 0.14mm/px · 2 of 2 slices shown]
[im 1/2]
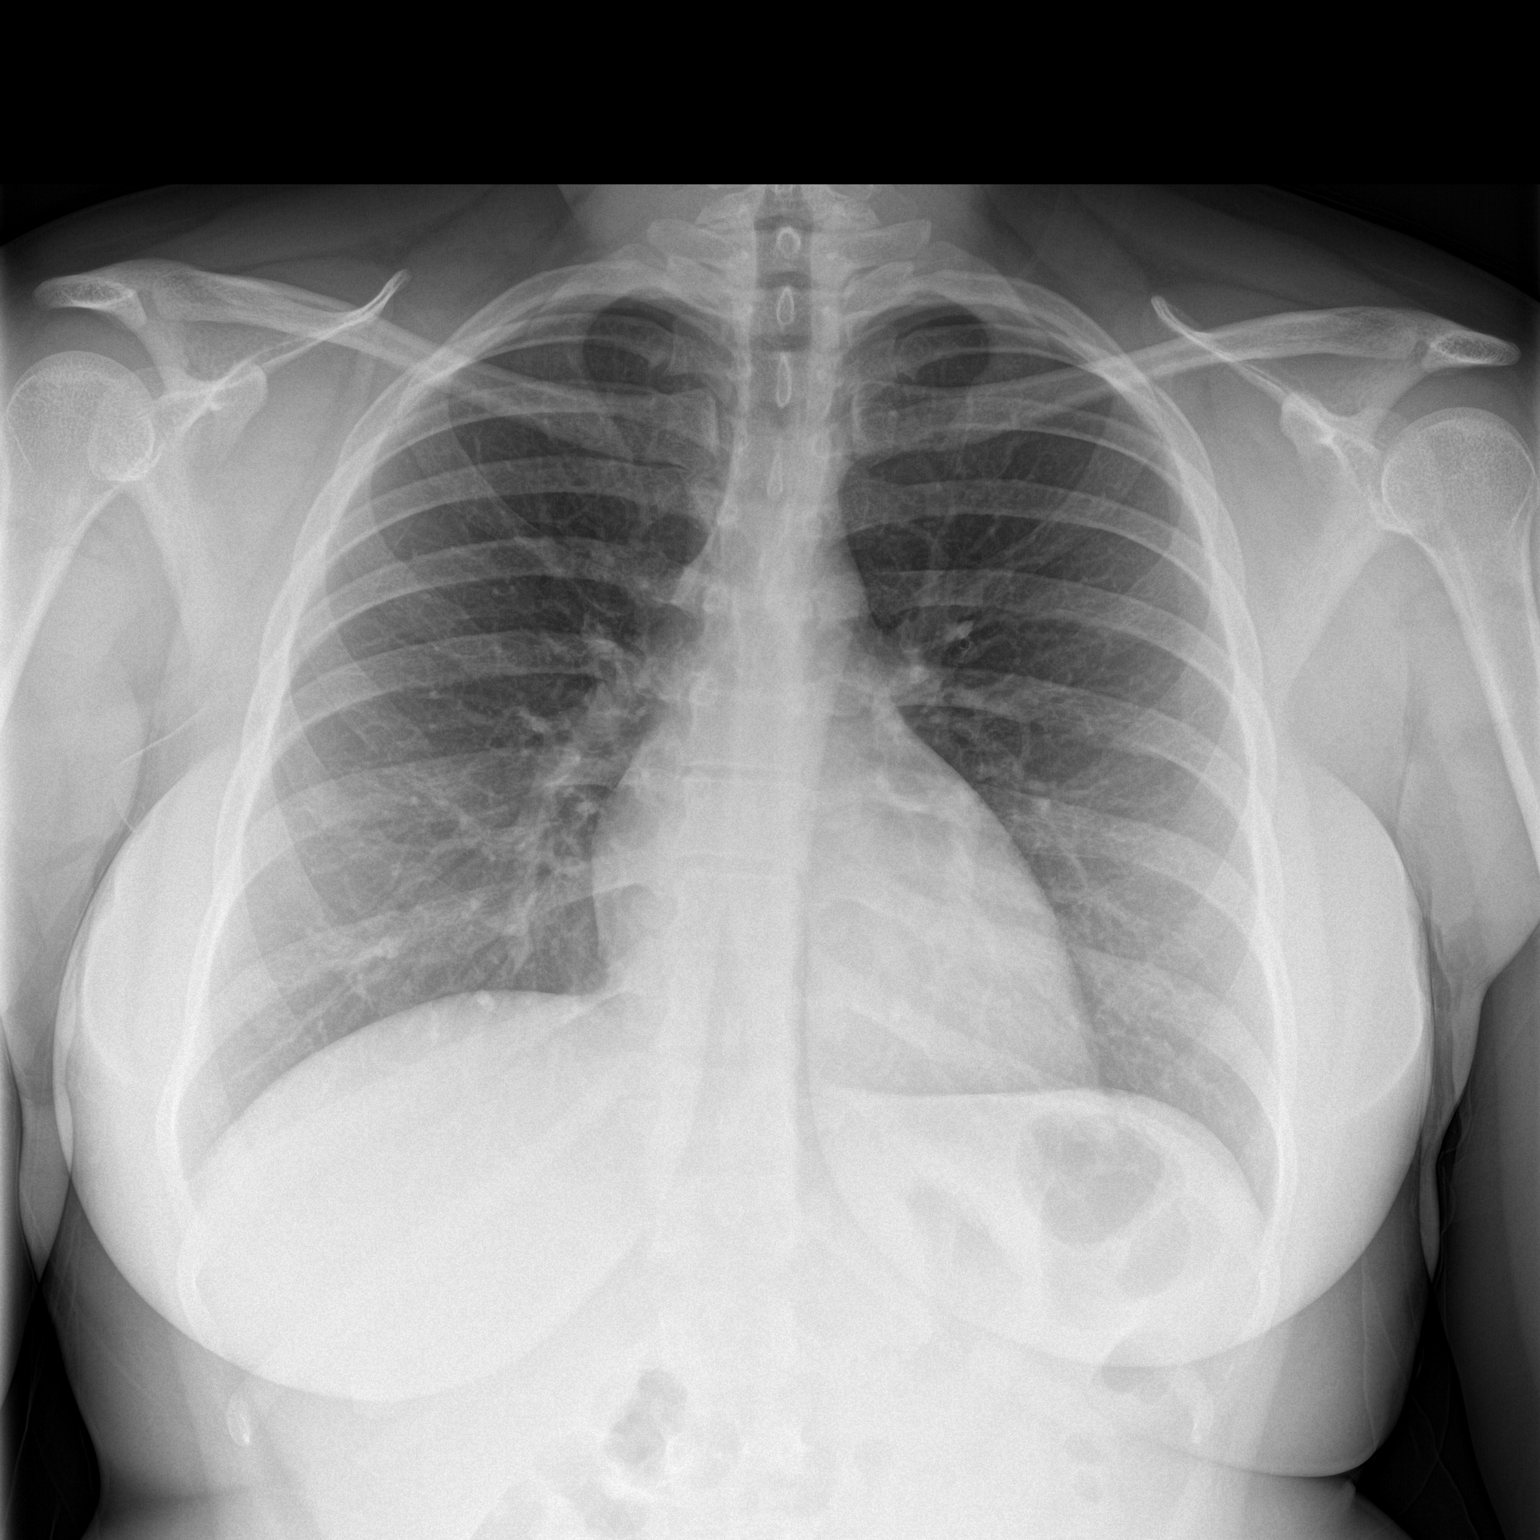
[im 2/2]
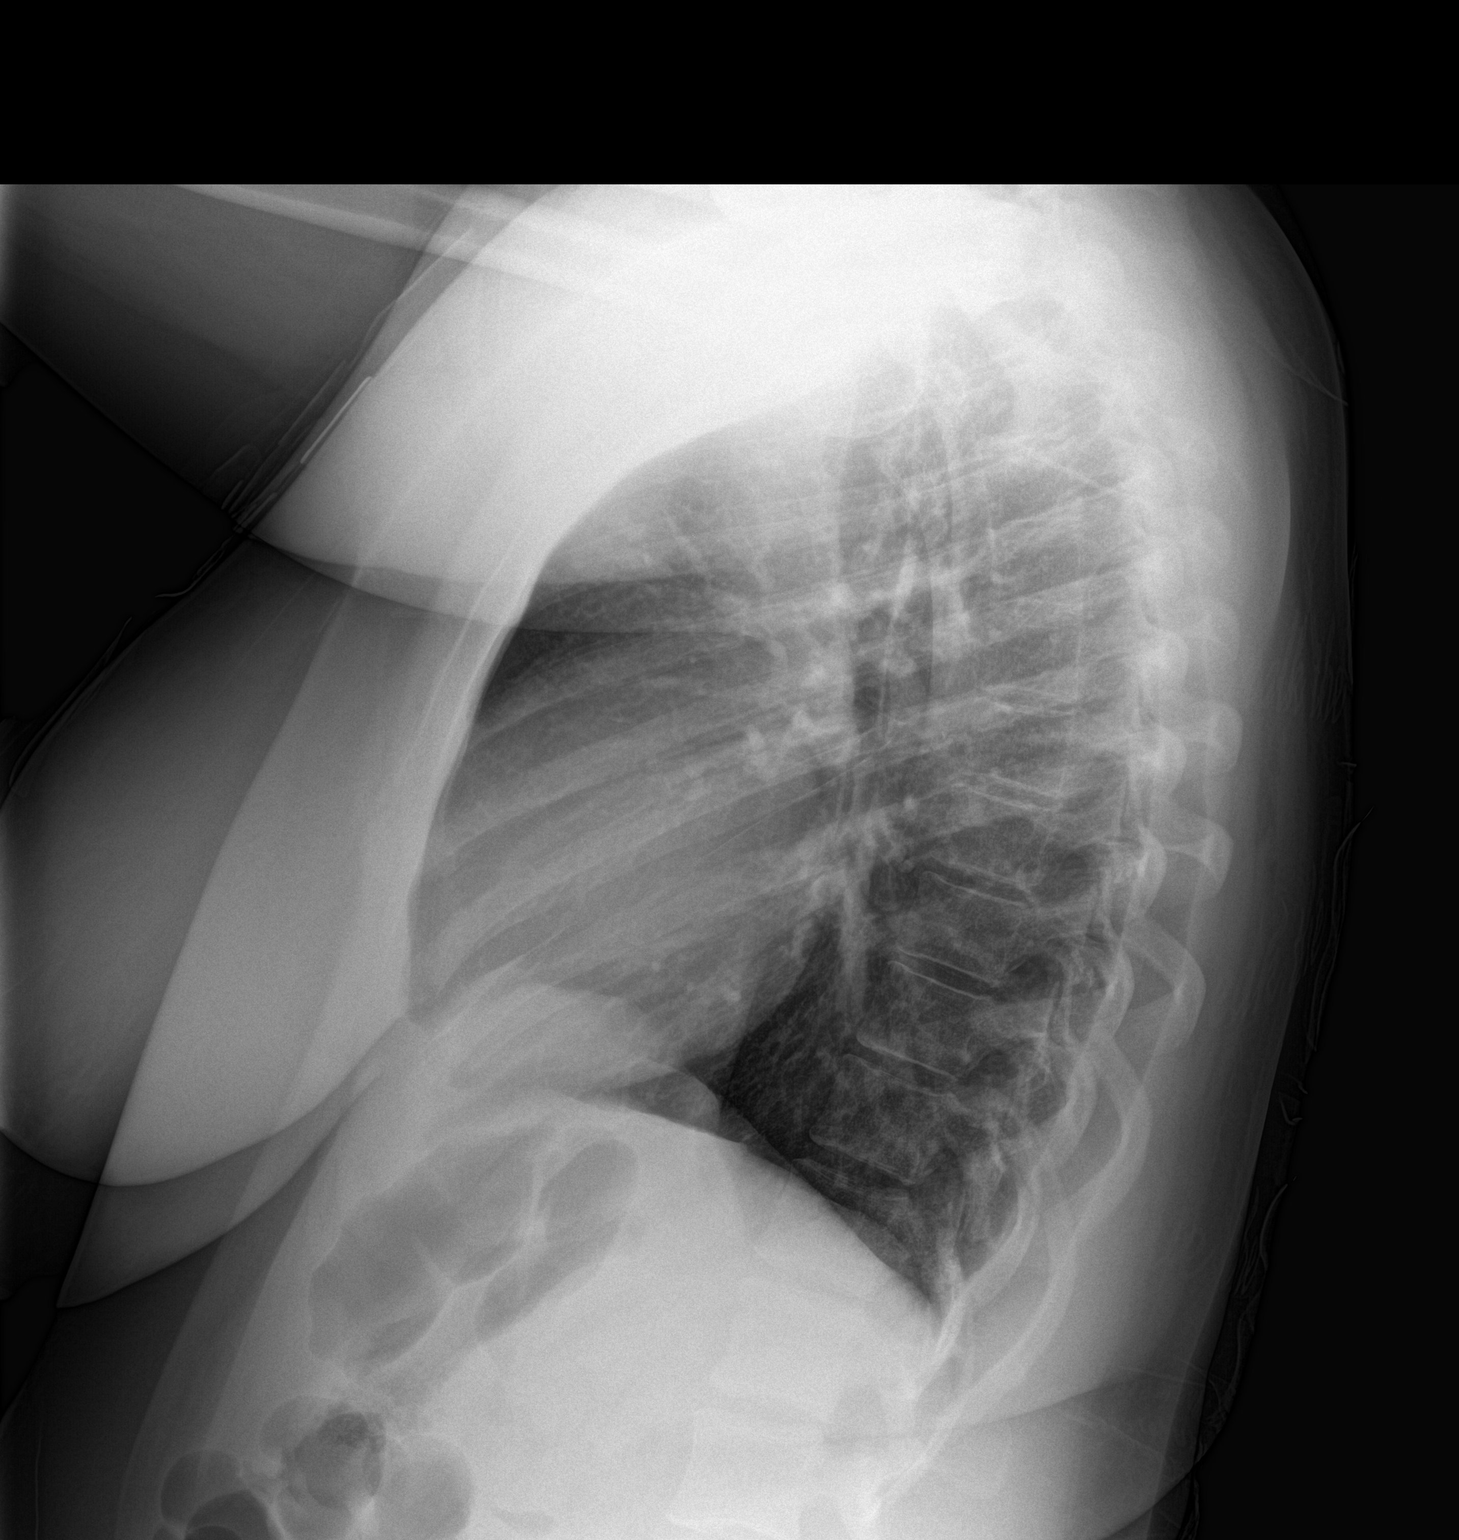

[2 of 2 positions shown; findings below may reference images not displayed]

FINDINGS: The heart size and mediastinal contours are within normal limits.
Both lungs are clear. The visualized skeletal structures are
unremarkable.
IMPRESSION: No active cardiopulmonary disease.

## 2016-06-09 ENCOUNTER — Emergency Department
Admission: EM | Admit: 2016-06-09 | Discharge: 2016-06-10 | Disposition: A | Discharge status: Home / Self Care | Payer: Medicaid Other | Attending: Emergency Medicine | Admitting: Emergency Medicine

## 2016-06-09 ENCOUNTER — Encounter: Payer: Self-pay | Admitting: Emergency Medicine

## 2016-06-09 DIAGNOSIS — Z3A09 9 weeks gestation of pregnancy: Secondary | ICD-10-CM | POA: Insufficient documentation

## 2016-06-09 DIAGNOSIS — N898 Other specified noninflammatory disorders of vagina: Secondary | ICD-10-CM | POA: Insufficient documentation

## 2016-06-09 DIAGNOSIS — F1290 Cannabis use, unspecified, uncomplicated: Secondary | ICD-10-CM | POA: Insufficient documentation

## 2016-06-09 DIAGNOSIS — O209 Hemorrhage in early pregnancy, unspecified: Secondary | ICD-10-CM | POA: Diagnosis present

## 2016-06-09 DIAGNOSIS — Z792 Long term (current) use of antibiotics: Secondary | ICD-10-CM | POA: Insufficient documentation

## 2016-06-09 DIAGNOSIS — O039 Complete or unspecified spontaneous abortion without complication: Secondary | ICD-10-CM | POA: Insufficient documentation

## 2016-06-09 LAB — CBC WITH DIFFERENTIAL/PLATELET
BASOS PCT: 1 %
Basophils Absolute: 0.1 10*3/uL (ref 0–0.1)
EOS PCT: 4 %
Eosinophils Absolute: 0.4 10*3/uL (ref 0–0.7)
HEMATOCRIT: 32.5 % — AB (ref 35.0–47.0)
Hemoglobin: 11.2 g/dL — ABNORMAL LOW (ref 12.0–16.0)
LYMPHS PCT: 25 %
Lymphs Abs: 2.6 10*3/uL (ref 1.0–3.6)
MCH: 29.7 pg (ref 26.0–34.0)
MCHC: 34.6 g/dL (ref 32.0–36.0)
MCV: 86 fL (ref 80.0–100.0)
MONO ABS: 0.4 10*3/uL (ref 0.2–0.9)
MONOS PCT: 4 %
NEUTROS ABS: 6.7 10*3/uL — AB (ref 1.4–6.5)
NEUTROS PCT: 66 %
PLATELETS: 302 10*3/uL (ref 150–440)
RBC: 3.78 MIL/uL — ABNORMAL LOW (ref 3.80–5.20)
RDW: 14.5 % (ref 11.5–14.5)
WBC: 10 10*3/uL (ref 3.6–11.0)

## 2016-06-09 LAB — ABO/RH: ABO/RH(D): AB POS

## 2016-06-09 NOTE — ED Triage Notes (Signed)
States last couple of days experienced light cramping and vaginal bleeding.  Today, cramping became intense and cramping was back to back, then experienced a big gush.  Went into [facility] and heard something splash in the toilet, looked and saw a fetus.

## 2016-06-09 NOTE — ED Provider Notes (Signed)
[facility] Emergency Department Provider Note  ____________________________________________   First MD Initiated Contact with Patient 06/09/16 2352     (approximate)  I have reviewed the triage vital signs and the nursing notes.   HISTORY  Chief Complaint Miscarriage    HPI Joyce [name] is a 25 y.o. female G5 P2 Ab 2 (both elective, most recent was 6 months ago, the first was 10 years ago)who presents for evaluation of copious vaginal bleeding tonight and the passage of fetal products.  She reports that she did not know she was pregnant.  Her last menstrual period was about 2 months ago but she is frequently irregular and did not think much about it.  She started light spotting several days ago and thought that her periods getting ready to begin.  She started heavier bleeding earlier today and then started having strong cramping abdominal pain occurring about every 5 minutes that she said felt like contractions.  She thought maybe she was just having or severe pain due to not having had a period in years but then she had a large gush of blood while lying in bed.  She went to the bathroom and continued to pass some blood and clots and then heard a popping sound in the toilet.  When she looked in the bowl she saw a small fetus.  She scooped it out of the toilet and brought it with her to the emergency department for evaluation.  She is calm and has good insight into the situation.  She is in no distress at this time.  She denies recent fever/chills, chest pain, shortness of breath, nausea, vomiting, dysuria, diarrhea.  Her cramping has resolved at this time.  Nothing in particular made it worse and it got better after the large gush of blood and passage of products.   Past Medical History:  Diagnosis Date  . Anemia   . Hidradenitis axillaris     Patient Active Problem List   Diagnosis Date Noted  . Hidradenitis axillaris 08/31/2015  . Anemia     Past  Surgical History:  Procedure Laterality Date  . INCISION AND DRAINAGE BREAST ABSCESS Left 11/2014  . OTHER SURGICAL HISTORY N/A    had c section, blood transfusion    Prior to Admission medications   Medication Sig Start Date End Date Taking? Authorizing Provider  clindamycin (CLEOCIN-T) 1 % external solution Apply to affected area 3 times daily 08/31/15 08/30/16  Ricarda Frame, MD  doxycycline (VIBRA-TABS) 100 MG tablet Take 1 tablet (100 mg total) by mouth 2 (two) times daily. 08/25/15   [name] Menshew, PA-C  JUNEL FE 1/20 1-20 MG-MCG tablet Take 1 tablet by mouth 1 day or 1 dose. 06/15/15   Historical Provider, MD    Allergies Review of patient's allergies indicates no known allergies.  Family History  Problem Relation Age of Onset  . Migraines Mother   . Stroke Maternal Grandmother   . Cancer Maternal Grandfather 70    throat  . Cancer Paternal Grandmother     Social History Social History  Substance Use Topics  . Smoking status: Never Smoker  . Smokeless tobacco: Never Used  . Alcohol use 0.0 oz/week     Comment: occa    Review of Systems Constitutional: No fever/chills Eyes: No visual changes. ENT: No sore throat. Cardiovascular: Denies chest pain. Respiratory: Denies shortness of breath. Gastrointestinal: No abdominal pain.  No nausea, no vomiting.  No diarrhea.  No constipation. Genitourinary: Vaginal  bleeding and passage of probable fetal products Musculoskeletal: Negative for back pain. Skin: Negative for rash. Neurological: Negative for headaches, focal weakness or numbness.  10-point ROS otherwise negative.  ____________________________________________   PHYSICAL EXAM:  VITAL SIGNS: ED Triage Vitals  Enc Vitals Group     BP 06/09/16 2145 101/67     Pulse Rate 06/09/16 2145 82     Resp 06/09/16 2145 16     Temp 06/09/16 2145 98.2 F (36.8 C)     Temp Source 06/09/16 2145 Oral     SpO2 06/09/16 2145 99 %     Weight --      Height --        Head Circumference --      Peak Flow --      Pain Score 06/09/16 2139 8     Pain Loc --      Pain Edu? --      Excl. in GC? --     Constitutional: Alert and oriented. Well appearing and in no acute distress. Eyes: Conjunctivae are normal. PERRL. EOMI. Head: Atraumatic. Nose: No congestion/rhinnorhea. Mouth/Throat: Mucous membranes are moist.  Oropharynx non-erythematous. Neck: No stridor.  No meningeal signs.   Cardiovascular: Normal rate, regular rhythm. Good peripheral circulation. Grossly normal heart sounds. Respiratory: Normal respiratory effort.  No retractions. Lungs CTAB. Gastrointestinal: Soft and nontender. No distention.  Genitourinary: Normal external exam.  Copious clotted dark blood in the vagina removed with Yankauer.  There were products of conception most consistent with a sac lodged within the cervical os which are removed with ring forceps and which seem to come out in their entirety.  The cervical os is open and continued to leak a small amount of dark blood after removing the products.  I cannot visualize any additional products within the vagina or the os.  The exam was nontender. Musculoskeletal: No lower extremity tenderness nor edema. No gross deformities of extremities. Neurologic:  Normal speech and language. No gross focal neurologic deficits are appreciated.  Skin:  Skin is warm, dry and intact. No rash noted. Psychiatric: Mood and affect are normal. Speech and behavior are normal.  ____________________________________________   LABS (all labs ordered are listed, but only abnormal results are displayed)  Labs Reviewed  CBC WITH DIFFERENTIAL/PLATELET - Abnormal; Notable for the following:       Result Value   RBC 3.78 (*)    Hemoglobin 11.2 (*)    HCT 32.5 (*)    Neutro Abs 6.7 (*)    All other components within normal limits  HCG, QUANTITATIVE, PREGNANCY - Abnormal; Notable for the following:    hCG, Beta Chain, Quant, S 2,227 (*)    All other  components within normal limits  ABO/RH   ____________________________________________  EKG  None - EKG not ordered by ED physician ____________________________________________  RADIOLOGY   US Ob Comp Less 14 Wks  Result Date: 06/10/2016 CLINICAL DATA:  Early pregnancy, spontaneous abortion. Cramping and vaginal bleeding for 2 days, beta HCG 2,227. Likely passed fetus this evening. EXAM: OBSTETRIC <14 WK [place] AND TRANSVAGINAL OB US TECHNIQUE: Both transabdominal and transvaginal ultrasound examinations were performed for complete evaluation of the gestation as well as the maternal uterus, adnexal regions, and pelvic cul-de-sac. Transvaginal technique was performed to assess early pregnancy. COMPARISON:  Obstetric ultrasound January 05, 2014 FINDINGS: Intrauterine gestational sac: Not present Yolk sac:  Not present Embryo:  Not present Cardiac Activity: Not present Subchorionic hemorrhage:  None visualized. Maternal uterus/adnexae: Thickened heterogeneous endometrium measures  up to 2.1 cm without vascularity. RIGHT ovary is not sonographically identified. 1.2 cm LEFT para ovarian anechoic cyst without solid components, vascularity. IMPRESSION: No intrauterine pregnancy identified. Heterogeneous debris within endometrium most compatible with hemorrhage, less likely retained products of conception though, recommend follow-up beta HCG to verify downward trend. Electronically Signed   By: Awilda Metroourtnay  Bloomer M.D.   On: 06/10/2016 02:21   [place] Ob Transvaginal  Result Date: 06/10/2016 CLINICAL DATA:  Early pregnancy, spontaneous abortion. Cramping and vaginal bleeding for 2 days, beta HCG 2,227. Likely passed fetus this evening. EXAM: OBSTETRIC <14 WK US AND TRANSVAGINAL OB US TECHNIQUE: Both transabdominal and transvaginal ultrasound examinations were performed for complete evaluation of the gestation as well as the maternal uterus, adnexal regions, and pelvic cul-de-sac. Transvaginal technique was performed to  assess early pregnancy. COMPARISON:  Obstetric ultrasound January 05, 2014 FINDINGS: Intrauterine gestational sac: Not present Yolk sac:  Not present Embryo:  Not present Cardiac Activity: Not present Subchorionic hemorrhage:  None visualized. Maternal uterus/adnexae: Thickened heterogeneous endometrium measures up to 2.1 cm without vascularity. RIGHT ovary is not sonographically identified. 1.2 cm LEFT para ovarian anechoic cyst without solid components, vascularity. IMPRESSION: No intrauterine pregnancy identified. Heterogeneous debris within endometrium most compatible with hemorrhage, less likely retained products of conception though, recommend follow-up beta HCG to verify downward trend. Electronically Signed   By: Awilda Metroourtnay  Bloomer M.D.   On: 06/10/2016 02:21    ____________________________________________   PROCEDURES  Procedure(s) performed:   Pelvic exam Date/Time: 06/10/2016 12:30 AM Performed by: Loleta RoseFORBACH, Fannye Myer Authorized by: Loleta RoseFORBACH, Travia Onstad  Consent: Verbal consent obtained. Risks and benefits: risks, benefits and alternatives were discussed Patient understanding: patient states understanding of the procedure being performed Patient identity confirmed: verbally with patient and arm band Patient tolerance: Patient tolerated the procedure well with no immediate complications      Critical Care performed: No ____________________________________________   INITIAL IMPRESSION / ASSESSMENT AND PLAN / ED COURSE  Pertinent labs & imaging results that were available during my care of the patient were reviewed by me and considered in my medical decision making (see chart for details).  The patient is Rh+ and does not require RhoGAM.  I am awaiting the results of her hCG and then we will order an ultrasound to see if she has any retained products.  We will perform a pelvic exam.  She is considering whether or not she wants to send the fetal products to the lab for testing.  She is in no  acute distress with normal vital signs.   Clinical Course  Comment By Time  The patient states that because she was not wanting to be pregnant and she R he has 2 children, she does not want any testing done on the products of conception.  I agree that this is appropriate under the circumstances.  Her hCG is just over 2000.  I am proceeding with ultrasound. Loleta Roseory Bladen Umar, MD 08/26 [phone]  Bleeding well controlled.  U/S as expected.  [facility] patient [facility] vs [facility]CG.  Gave usual/customary return precautions.  She agrees with plan. Loleta Roseory Hart Haas, MD 08/26 0255    ____________________________________________  FINAL CLINICAL IMPRESSION(S) / ED DIAGNOSES  Final diagnoses:  Spontaneous miscarriage     MEDICATIONS GIVEN DURING THIS VISIT:  Medications - No data to display   NEW OUTPATIENT MEDICATIONS STARTED DURING THIS VISIT:  New Prescriptions   No medications on file      Note:  This document was prepared using Dragon voice recognition software and may include unintentional dictation errors.    Loleta Rose, MD 06/10/16 4098

## 2016-06-10 ENCOUNTER — Emergency Department: Payer: Medicaid Other

## 2016-06-10 LAB — HCG, QUANTITATIVE, PREGNANCY: hCG, Beta Chain, Quant, S: 2227 m[IU]/mL — ABNORMAL HIGH (ref <5)

## 2016-06-10 NOTE — ED Notes (Signed)
MD at bedside. 

## 2016-06-10 NOTE — Discharge Instructions (Signed)
You have been seen in the Emergency Department (ED) for a spontaneous miscarriage.  Fortunately, your evaluation was reassuring in spite of losing the early pregnancy.  Please follow up with an OB/GYN provider next week; you may need a repeat ultrasound and/or blood work.  As a result of your blood type, you did not receive an injection of medication called Rhogam - please let your OB/Gyn know.  Please follow up as recommended above.  If you develop any other symptoms that concern you (including, but not limited to, persistent vomiting, worsening bleeding, abdominal or pelvic pain, or fever greater than 101), please return immediately to the Emergency Department.

## 2016-06-10 NOTE — ED Notes (Signed)
Pt presents to ED with vaginal bleeding. Pt states didn't know she was pregnant. Pt states last couple of days experienced light cramping and vaginal bleeding and constipation and nausea.  Tonight around 9pm cramping became intense like contraction, states felt a big gush. Then she went into bathroom then saw a  Fetus in the toilet.Pt brought products of conception with her. Pt states 10-12 pads since 9pm. Abd pain into lower back 10/10. Pt having continuous bleeding. Pt states has headache and nausea. Pt states 2 live children, 2 elective abortion one in Feb 2017 and one when she was 25 years old. Pt in bed resting no distress noted. Awaiting on MD to do pelvic exam.

## 2016-06-10 NOTE — ED Notes (Signed)
Discharge instructions reviewed with patient. Questions fielded by this RN. Patient verbalizes understanding of instructions. Patient discharged home in stable condition per Jeanie SewerForbch MD . No acute distress noted at time of discharge.

## 2016-06-10 NOTE — ED Notes (Signed)
Patient transported to Ultrasound 

## 2016-06-21 ENCOUNTER — Telehealth: Payer: Self-pay | Admitting: Emergency Medicine

## 2016-06-21 NOTE — Telephone Encounter (Signed)
Product of conceptions were delivered to [facility], spoke with Dr.Forbach ( ER physician), concerning products, he recalls the "patient did not want any testing done"

## 2017-10-27 IMAGING — US US OB COMP LESS 14 WK
1 series · 14 of 28 positions shown · non-contrast
Comparison: Obstetric ultrasound January 05, 2014

CLINICAL DATA: Early pregnancy, spontaneous abortion. Cramping and
vaginal bleeding for 2 days, beta HCG [DATE]. Likely passed fetus
this evening.

EXAM:
OBSTETRIC <14 WK US AND TRANSVAGINAL OB US
TECHNIQUE: Both transabdominal and transvaginal ultrasound examinations were
performed for complete evaluation of the gestation as well as the
maternal uterus, adnexal regions, and pelvic cul-de-sac.
Transvaginal technique was performed to assess early pregnancy.

[Series 1: us ob comp less 14 wk · 0.23mm/px · 14 of 82 slices shown]
[im 4/82]
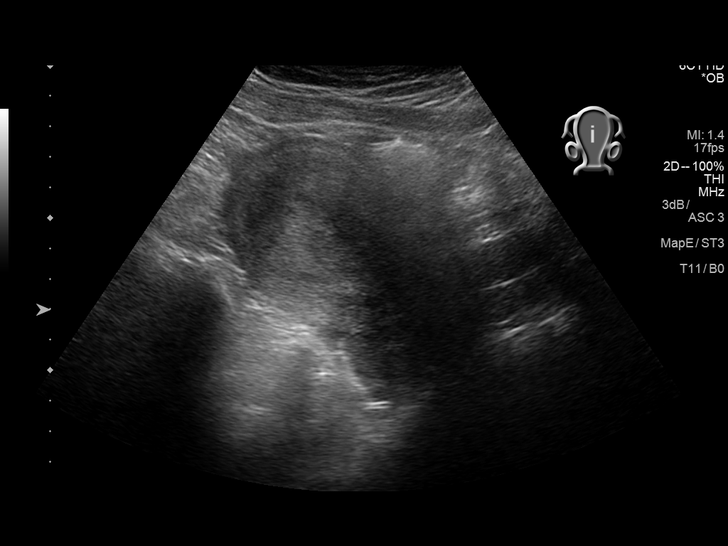
[im 10/82]
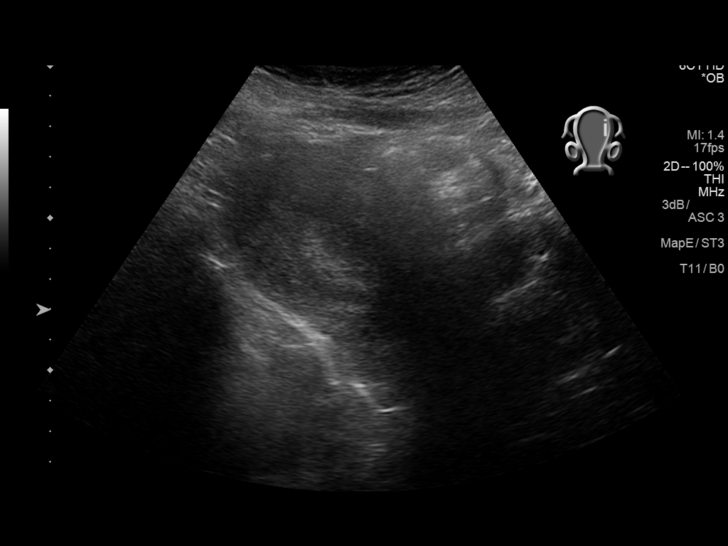
[im 16/82]
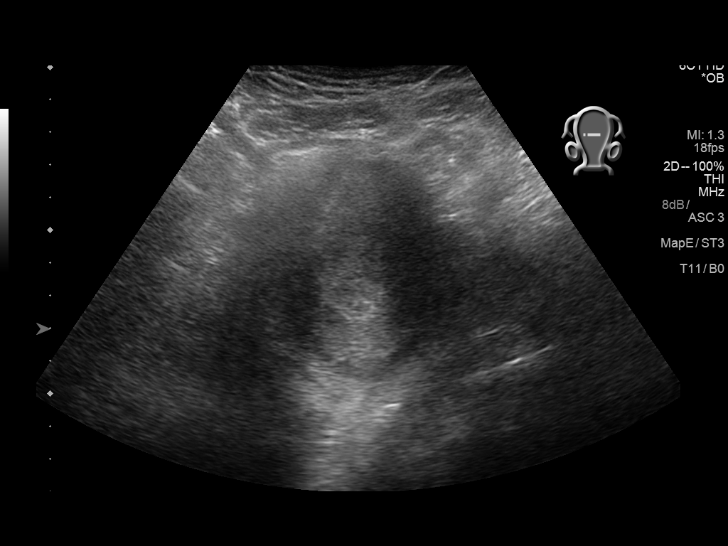
[im 22/82]
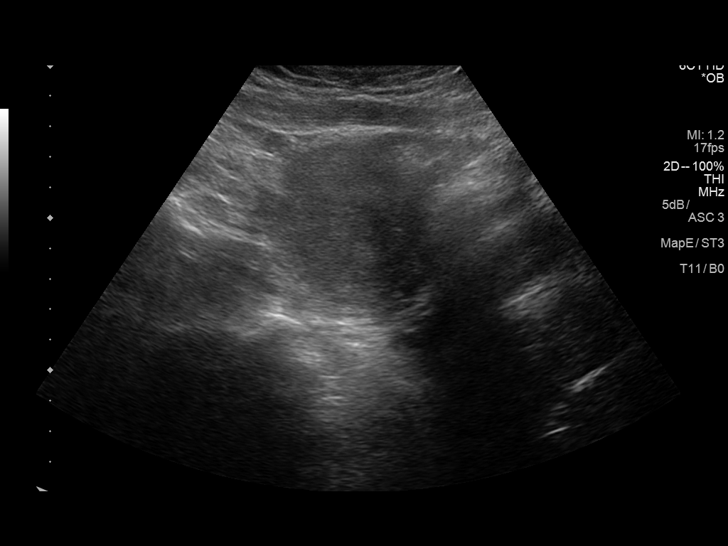
[im 28/82]
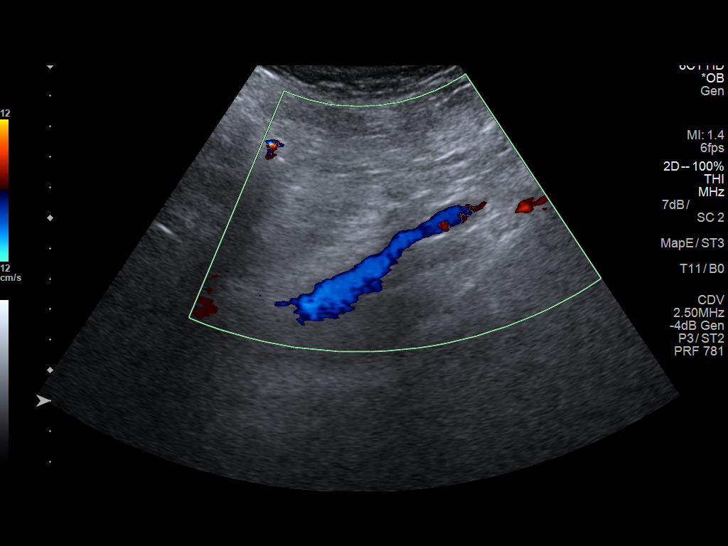
[im 34/82]
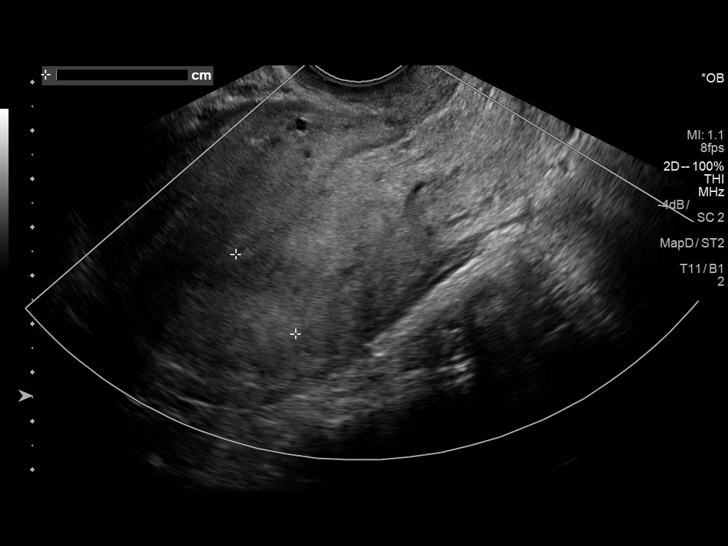
[im 40/82]
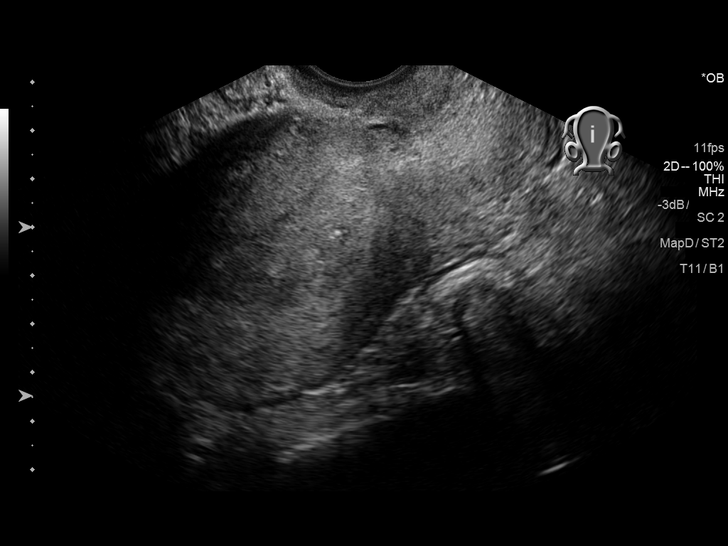
[im 46/82]
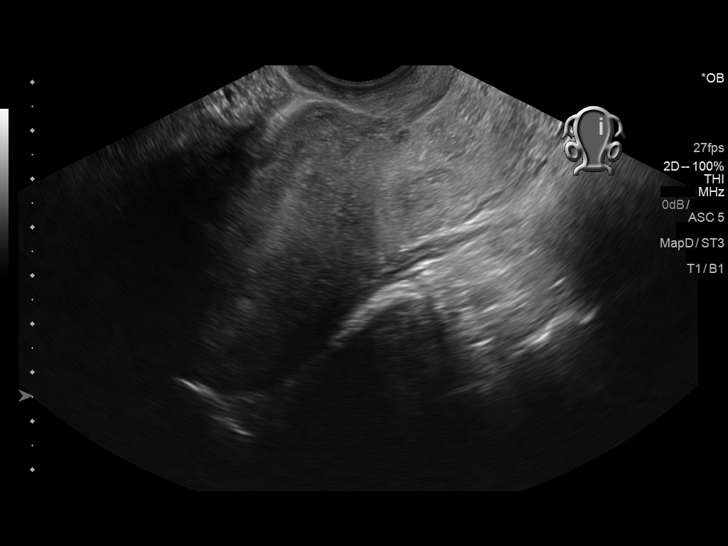
[im 52/82]
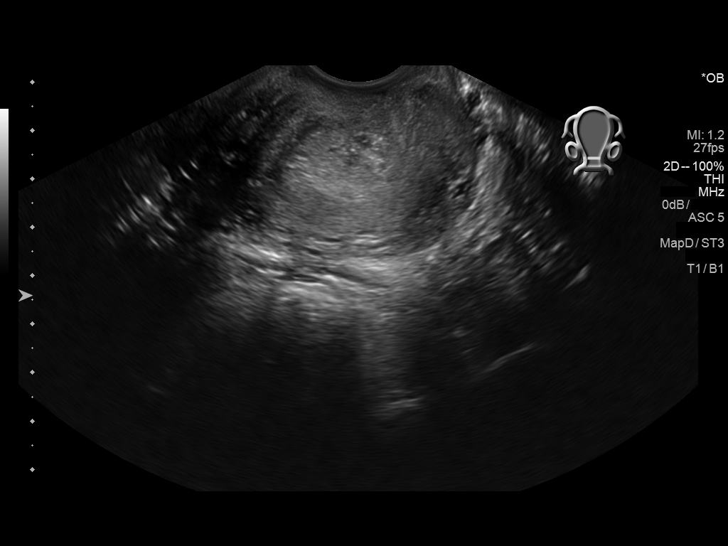
[im 58/82]
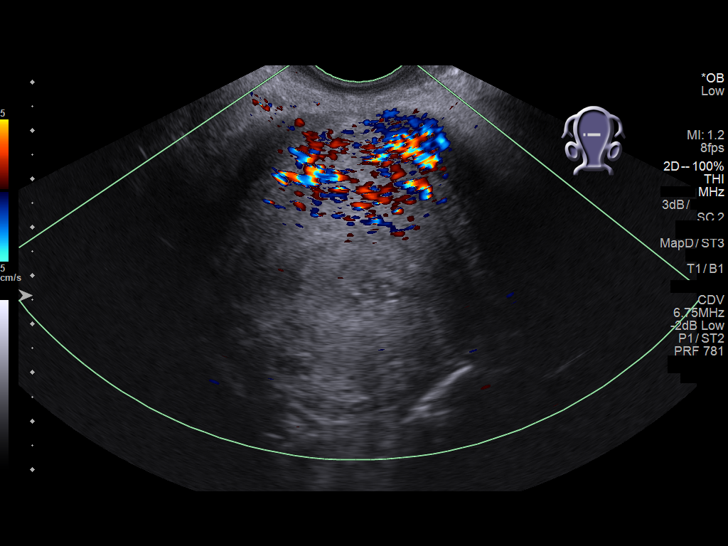
[im 64/82]
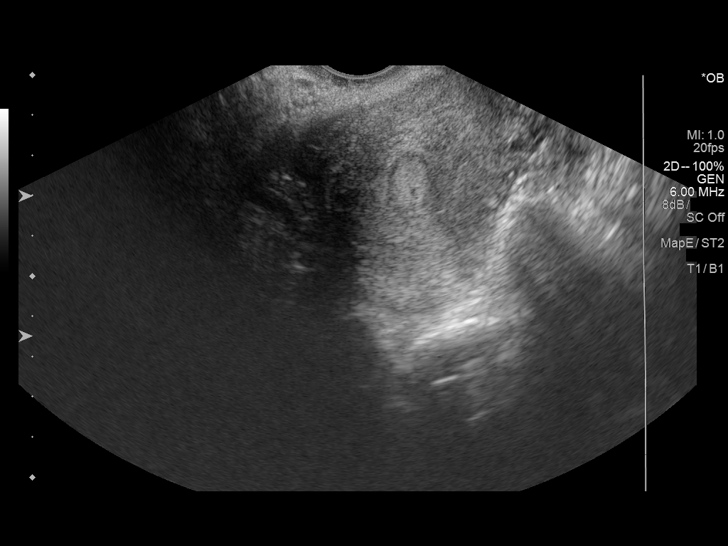
[im 70/82]
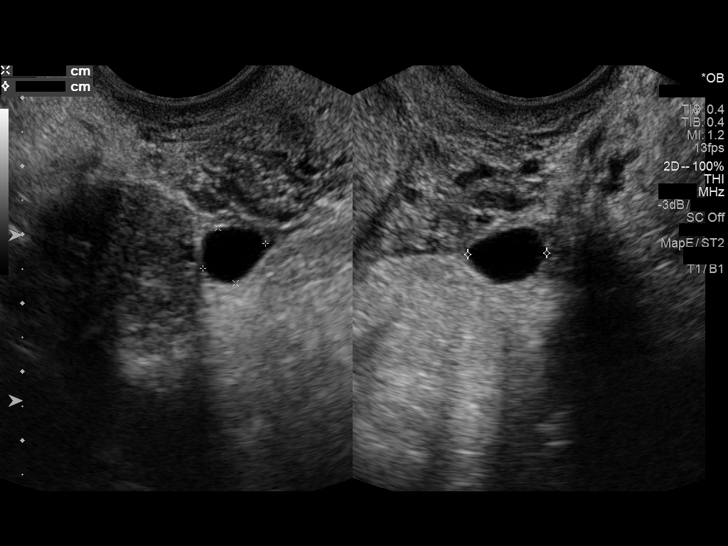
[im 76/82]
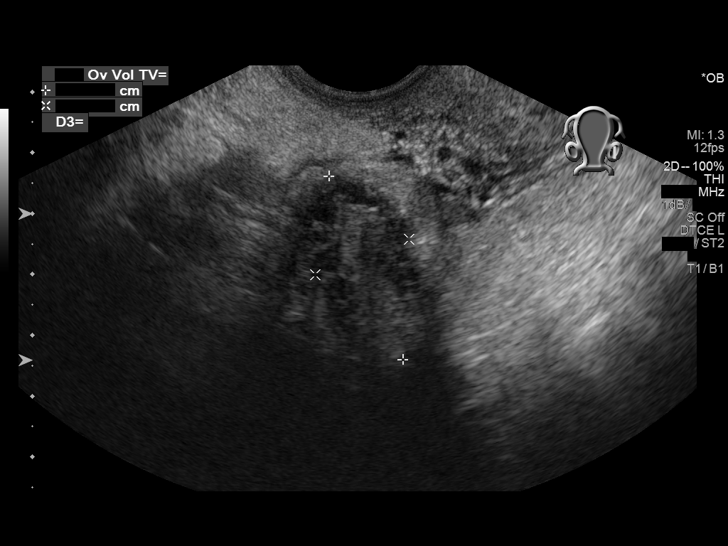
[im 82/82]
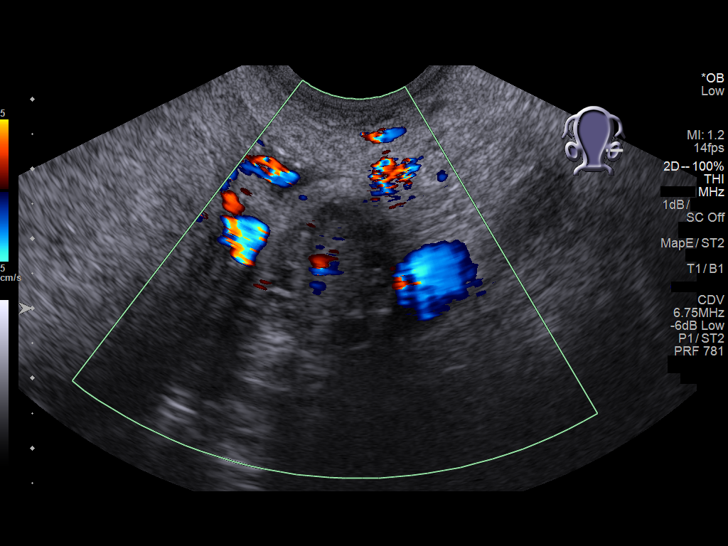

[14 of 28 positions shown; findings below may reference images not displayed]

FINDINGS: Intrauterine gestational sac: Not present

Yolk sac:  Not present

Embryo:  Not present

Cardiac Activity: Not present

Subchorionic hemorrhage:  None visualized.

Maternal uterus/adnexae: Thickened heterogeneous endometrium
measures up to 2.1 cm without vascularity. RIGHT ovary is not
sonographically identified. 1.2 cm LEFT para ovarian anechoic cyst
without solid components, vascularity.
IMPRESSION: No intrauterine pregnancy identified. Heterogeneous debris within
endometrium most compatible with hemorrhage, less likely retained
products of conception though, recommend follow-up beta HCG to
verify downward trend.

## 2018-03-18 ENCOUNTER — Encounter: Payer: Self-pay | Admitting: Emergency Medicine

## 2018-03-18 ENCOUNTER — Emergency Department
Admission: EM | Admit: 2018-03-18 | Discharge: 2018-03-18 | Disposition: A | Discharge status: Home / Self Care | Payer: Self-pay | Attending: Emergency Medicine | Admitting: Emergency Medicine

## 2018-03-18 ENCOUNTER — Other Ambulatory Visit: Payer: Self-pay

## 2018-03-18 DIAGNOSIS — L732 Hidradenitis suppurativa: Secondary | ICD-10-CM | POA: Insufficient documentation

## 2018-03-18 DIAGNOSIS — Z79899 Other long term (current) drug therapy: Secondary | ICD-10-CM | POA: Insufficient documentation

## 2018-03-18 MED ORDER — SULFAMETHOXAZOLE-TRIMETHOPRIM 800-160 MG PO TABS: 1.0000 | ORAL_TABLET | Freq: Two times a day (BID) | ORAL | 0 refills | Status: DC

## 2018-03-18 MED ORDER — OXYCODONE-ACETAMINOPHEN 5-325 MG PO TABS: 1.0000 | ORAL_TABLET | Freq: Four times a day (QID) | ORAL | 0 refills | Status: AC | PRN

## 2018-03-18 MED ORDER — LIDOCAINE-EPINEPHRINE (PF) 1 %-1:200000 IJ SOLN
30.0000 mL | Freq: Once | INTRAMUSCULAR | Status: AC
  Administered 2018-03-18: 30 mL
  Filled 2018-03-18 (×2): qty 30

## 2018-03-18 MED ORDER — LIDOCAINE-EPINEPHRINE 1 %-1:100000 IJ SOLN
INTRAMUSCULAR | Status: AC
  Filled 2018-03-18: qty 1

## 2018-03-18 NOTE — ED Provider Notes (Signed)
[facility] Emergency Department Provider Note  ____________________________________________  Time seen: Approximately 8:36 AM  I have reviewed the triage vital signs and the nursing notes.   HISTORY  Chief Complaint Abscess   HPI Joyce [name] is a 27 y.o. female who presents to the ER for pain and swelling of the left axilla. She has a history of the same. Flare up started on Saturday. Questionable fever last week with body aches, but has since resolved.   Past Medical History:  Diagnosis Date  . Anemia   . Hidradenitis axillaris     Patient Active Problem List   Diagnosis Date Noted  . Hidradenitis axillaris 08/31/2015  . Anemia     Past Surgical History:  Procedure Laterality Date  . INCISION AND DRAINAGE BREAST ABSCESS Left 11/2014  . OTHER SURGICAL HISTORY N/A    had c section, blood transfusion    Prior to Admission medications   Medication Sig Start Date End Date Taking? Authorizing Provider  JUNEL FE 1/20 1-20 MG-MCG tablet Take 1 tablet by mouth 1 day or 1 dose. 06/15/15   [provider]  oxyCODONE-acetaminophen (PERCOCET) 5-325 MG tablet Take 1 tablet by mouth every 6 (six) hours as needed for up to 3 days for severe pain. 03/18/18 03/21/18  Lauri Purdum, Rulon Eisenmenger B, FNP  sulfamethoxazole-trimethoprim (BACTRIM DS,SEPTRA DS) 800-160 MG tablet Take 1 tablet by mouth 2 (two) times daily. 03/18/18   Chinita Pester, FNP    Allergies Patient has no known allergies.  Family History  Problem Relation Age of Onset  . Migraines Mother   . Stroke Maternal Grandmother   . Cancer Maternal Grandfather 70       throat  . Cancer Paternal Grandmother     Social History Social History   Tobacco Use  . Smoking status: Never Smoker  . Smokeless tobacco: Never Used  Substance Use Topics  . Alcohol use: Yes    Alcohol/week: 0.0 oz    Comment: occa  . Drug use: Yes    Comment: Marihuana    Review of Systems  Constitutional: Negative  for fever. Respiratory: Negative for cough or shortness of breath.  Musculoskeletal: Negataive for myalgias Skin: Positive for induration of the left axilla. Neurological: Negative for numbness or paresthesias. ____________________________________________   PHYSICAL EXAM:  VITAL SIGNS: ED Triage Vitals [03/18/18 0806]  Enc Vitals Group     BP 111/67     Pulse Rate 65     Resp 20     Temp 98.1 F (36.7 C)     Temp Source Oral     SpO2 100 %     Weight 170 lb (77.1 kg)     Height 5\' 1"  (1.549 m)     Head Circumference      Peak Flow      Pain Score 7     Pain Loc      Pain Edu?      Excl. in GC?      Constitutional: Well appearing. Eyes: Conjunctivae are clear without discharge or drainage. Nose: No rhinorrhea noted. Mouth/Throat: Airway is patent.  Neck: No stridor. Unrestricted range of motion observed. Lymphatic: No axially nodes.  Cardiovascular: Capillary refill is <3 seconds.  Respiratory: Respirations are even and unlabored.. Musculoskeletal: Unrestricted range of motion observed. Neurologic: Awake, alert, and oriented x 4.  Skin: 3 areas of fluctuance and induration under the left axilla. One area is draining spontaneously.   ____________________________________________   LABS (all labs ordered are  listed, but only abnormal results are displayed)  Labs Reviewed - No data to display ____________________________________________  EKG  Not indicated. ____________________________________________  RADIOLOGY  Not indicated. ____________________________________________   PROCEDURES  .Marland Kitchen.Incision and Drainage Date/Time: 03/18/2018 10:58 AM Performed by: Chinita Pesterriplett, Hadja Harral B, FNP Authorized by: Chinita Pesterriplett, Tekelia Kareem B, FNP   Consent:    Consent obtained:  Verbal   Consent given by:  Patient   Risks discussed:  Bleeding, infection, incomplete drainage and pain   Alternatives discussed:  Alternative treatment and delayed treatment Location:    Type:  Abscess    Location: left axilla. Pre-procedure details:    Skin preparation:  Betadine Anesthesia (see MAR for exact dosages):    Anesthesia method:  Local infiltration   Local anesthetic:  Lidocaine 1% w/o epi Procedure type:    Complexity:  Complex Procedure details:    Incision types:  Single straight   Incision depth:  Dermal   Scalpel blade:  11   Wound management:  Probed and deloculated   Drainage:  Bloody and purulent   Drainage amount:  Scant   Wound treatment:  Drain placed   Packing materials:  1/4 in iodoform gauze Post-procedure details:    Patient tolerance of procedure:  Tolerated well, no immediate complications Comments:     Hidradenitis lesions tunneling. Area of I&D with return of blood, but with pressure, the spontaneously draining area expressed purulent fluid. That area was packed.   ____________________________________________   INITIAL IMPRESSION / ASSESSMENT AND PLAN / ED COURSE  Joyce [name] is a 27 y.o. female who presents to the emergency department for treatment and evaluation of hidradenitis. I&D was performed. She will be placed on Bactrim and given a few days of Percocet. She is to follow up with surgery when possible. She is to return to the ER for symptoms that change or worsen or for new concerns if unable to schedule an appointment.  Medications  lidocaine-EPINEPHrine (XYLOCAINE W/EPI) 1 %-1:100000 (with pres) injection (has no administration in time range)  lidocaine-EPINEPHrine (XYLOCAINE-EPINEPHrine) 1 %-1:200000 (PF) injection 30 mL (30 mLs Infiltration Given by Other 03/18/18 1036)     Pertinent labs & imaging results that were available during my care of the patient were reviewed by me and considered in my medical decision making (see chart for details). ____________________________________________   FINAL CLINICAL IMPRESSION(S) / ED DIAGNOSES  Final diagnoses:  Hidradenitis axillaris    ED Discharge Orders        Ordered     sulfamethoxazole-trimethoprim (BACTRIM DS,SEPTRA DS) 800-160 MG tablet  2 times daily     03/18/18 1033    oxyCODONE-acetaminophen (PERCOCET) 5-325 MG tablet  Every 6 hours PRN     03/18/18 1033       Note:  This document was prepared using Dragon voice recognition software and may include unintentional dictation errors.    Chinita Pesterriplett, Dravyn Severs B, FNP 03/18/18 1104    Arnaldo NatalMalinda, Paul F, MD 03/18/18 1515

## 2018-03-18 NOTE — ED Triage Notes (Signed)
Abscess L axilla x 2 days.

## 2018-03-18 NOTE — ED Notes (Signed)
See triage note  Presents with possible abscess area under left arm  Noticed the areas several days ago  Hx of same

## 2018-03-25 ENCOUNTER — Encounter: Payer: Self-pay | Admitting: Emergency Medicine

## 2018-03-25 ENCOUNTER — Other Ambulatory Visit: Payer: Self-pay

## 2018-03-25 ENCOUNTER — Emergency Department
Admission: EM | Admit: 2018-03-25 | Discharge: 2018-03-25 | Disposition: A | Discharge status: Home / Self Care | Payer: Medicaid Other | Attending: Emergency Medicine | Admitting: Emergency Medicine

## 2018-03-25 DIAGNOSIS — Z79899 Other long term (current) drug therapy: Secondary | ICD-10-CM | POA: Insufficient documentation

## 2018-03-25 DIAGNOSIS — L02411 Cutaneous abscess of right axilla: Secondary | ICD-10-CM | POA: Insufficient documentation

## 2018-03-25 DIAGNOSIS — L0291 Cutaneous abscess, unspecified: Secondary | ICD-10-CM

## 2018-03-25 MED ORDER — TRAMADOL HCL 50 MG PO TABS: 50.0000 mg | ORAL_TABLET | Freq: Four times a day (QID) | ORAL | 0 refills | Status: DC | PRN

## 2018-03-25 MED ORDER — CLINDAMYCIN PHOSPHATE 600 MG/50ML IV SOLN
600.0000 mg | Freq: Once | INTRAVENOUS | Status: AC
  Administered 2018-03-25: 600 mg via INTRAVENOUS
  Filled 2018-03-25: qty 50

## 2018-03-25 MED ORDER — CLINDAMYCIN HCL 150 MG PO CAPS: 300.0000 mg | ORAL_CAPSULE | Freq: Three times a day (TID) | ORAL | 0 refills | Status: DC

## 2018-03-25 NOTE — Discharge Instructions (Signed)
Follow-up with your regular doctor or the surgeon that you have made an appointment with if not getting better in 2 to 3 days.  Also you could return to the emergency department if worsening.  At this time the abscesses do not need to be lanced.  If they worsen we will attempt opening them.  Use the medication as prescribed.  Apply a warm compress to each area at least 3 times a day.  Take Tylenol or ibuprofen as needed for pain.

## 2018-03-25 NOTE — ED Triage Notes (Signed)
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  Seen here last week for same under left arm.  Noticed this area over the weekend.

## 2018-03-25 NOTE — ED Notes (Signed)
See triage note  Presents with possible abscess under right arm    States she was here last Monday for same under left arm

## 2018-03-25 NOTE — ED Provider Notes (Signed)
[facility] Emergency Department Provider Note  ____________________________________________   First MD Initiated Contact with Patient 03/25/18 [id number]     (approximate)  I have reviewed the triage vital signs and the nursing notes.   HISTORY  Chief Complaint No chief complaint on file.    HPI Joyce [name] is a 27 y.o. female presents to the emergency department complaining of an abscess in the right axilla.  She states that she has hidradenitis and that she gets them frequently.  She states she was seen last week for an abscess in the left axilla.  She states the provider tried to drain the area but got very little pus out of it.  She states is still very sore.  She was given a prescription for Septra DS but the patient states she had a reaction after taking the medication for 2 days.  She broke out in hives all over her arms.  Now she has an abscess in the right axilla also.  She does have an appointment with a surgeon later in June.  She denies any fever or chills at this time.   Past Medical History:  Diagnosis Date  . Anemia   . Hidradenitis axillaris     Patient Active Problem List   Diagnosis Date Noted  . Hidradenitis axillaris 08/31/2015  . Anemia     Past Surgical History:  Procedure Laterality Date  . INCISION AND DRAINAGE BREAST ABSCESS Left 11/2014  . OTHER SURGICAL HISTORY N/A    had c section, blood transfusion    Prior to Admission medications   Medication Sig Start Date End Date Taking? Authorizing Provider  clindamycin (CLEOCIN) 150 MG capsule Take 2 capsules (300 mg total) by mouth 3 (three) times daily. 03/25/18   Sherrie MustacheFisher, Roselyn BeringSusan W, PA-C  JUNEL FE 1/20 1-20 MG-MCG tablet Take 1 tablet by mouth 1 day or 1 dose. 06/15/15   [provider]  traMADol (ULTRAM) 50 MG tablet Take 1 tablet (50 mg total) by mouth every 6 (six) hours as needed. 03/25/18   Faythe GheeFisher, Cherysh Epperly W, PA-C    Allergies Sulfa antibiotics  Family History    Problem Relation Age of Onset  . Migraines Mother   . Stroke Maternal Grandmother   . Cancer Maternal Grandfather 70       throat  . Cancer Paternal Grandmother     Social History Social History   Tobacco Use  . Smoking status: Never Smoker  . Smokeless tobacco: Never Used  Substance Use Topics  . Alcohol use: Yes    Alcohol/week: 0.0 oz    Comment: occa  . Drug use: Yes    Comment: Marihuana    Review of Systems  Constitutional: No fever/chills Eyes: No visual changes. ENT: No sore throat. Respiratory: Denies cough Genitourinary: Negative for dysuria. Musculoskeletal: Negative for back pain. Skin: Negative for rash.  Positive for abscesses in both axillas    ____________________________________________   PHYSICAL EXAM:  VITAL SIGNS: ED Triage Vitals  Enc Vitals Group     BP 03/25/18 0727 117/72     Pulse Rate 03/25/18 0727 80     Resp 03/25/18 0727 12     Temp 03/25/18 0727 98 F (36.7 C)     Temp Source 03/25/18 0727 Oral     SpO2 03/25/18 0727 98 %     Weight 03/25/18 0731 171 lb (77.6 kg)     Height 03/25/18 0731 5\' 1"  (1.549 m)     Head Circumference --  Peak Flow --      Pain Score 03/25/18 0731 10     Pain Loc --      Pain Edu? --      Excl. in GC? --     Constitutional: Alert and oriented. Well appearing and in no acute distress. Eyes: Conjunctivae are normal.  Head: Atraumatic. Nose: No congestion/rhinnorhea. Mouth/Throat: Mucous membranes are moist.   Neck: Is supple, no lymphadenopathy is noted.  No cervical tenderness Cardiovascular: Normal rate, regular rhythm.  Heart sounds are normal Respiratory: Normal respiratory effort.  No retractions, lungs clear to all station GU: deferred Musculoskeletal: FROM all extremities, warm and well perfused Neurologic:  Normal speech and language.  Skin:  Skin is warm, dry there is some drainage noted from the.  Abscesses in the right axilla.  The left axilla has scarring and obvious  hidradenitis.  She is neurovascularly intact. Psychiatric: Mood and affect are normal. Speech and behavior are normal.  ____________________________________________   LABS (all labs ordered are listed, but only abnormal results are displayed)  Labs Reviewed - No data to display ____________________________________________   ____________________________________________  RADIOLOGY    ____________________________________________   PROCEDURES  Procedure(s) performed: Saline lock, clindamycin 600 mg IV  Procedures    ____________________________________________   INITIAL IMPRESSION / ASSESSMENT AND PLAN / ED COURSE  Pertinent labs & imaging results that were available during my care of the patient were reviewed by me and considered in my medical decision making (see chart for details).  Patient is 27 year old female with a history of hidradenitis that presents to the emergency department complaining of abscesses in both axillas.  She was seen last week for the abscess in the left axilla was given Septra.  She states she had a reaction to this and got hives all over her arms.  She denies any fever or chills.  She states now the area in the right axilla has become worse.  On physical exam the right axilla has some active drainage from the abscess noted.  The left axilla is still tender to palpation but there is no fluctuant area.  The remainder of the exam is benign  Explained the findings to the patient.  She was given clindamycin 600 mg IV.  She was given a prescription for clindamycin to start tomorrow.  She is given tramadol 50 mg #15 with no refill for pain as needed.  She is to follow-up with a surgeon.  She is to return to the emergency department if worsening.  A dressing was applied to the area that was draining.  She was discharged in stable condition  As part of my medical decision making, I reviewed the following data within the electronic MEDICAL RECORD NUMBER Nursing  notes reviewed and incorporated, Old chart reviewed, Notes from prior ED visits and [place] Controlled Substance Database  ____________________________________________   FINAL CLINICAL IMPRESSION(S) / ED DIAGNOSES  Final diagnoses:  Abscess      NEW MEDICATIONS STARTED DURING THIS VISIT:  Discharge Medication List as of 03/25/2018  8:44 AM    START taking these medications   Details  clindamycin (CLEOCIN) 150 MG capsule Take 2 capsules (300 mg total) by mouth 3 (three) times daily., Starting Mon 03/25/2018, Print    traMADol (ULTRAM) 50 MG tablet Take 1 tablet (50 mg total) by mouth every 6 (six) hours as needed., Starting Mon 03/25/2018, Print         Note:  This document was prepared using Dragon voice recognition software and may  include unintentional dictation errors.    Faythe Ghee, PA-C 03/25/18 1038    Don Perking, [place], MD 03/26/18 [phone]

## 2018-04-11 ENCOUNTER — Ambulatory Visit (INDEPENDENT_AMBULATORY_CARE_PROVIDER_SITE_OTHER): Payer: Self-pay | Admitting: Surgery

## 2018-04-11 ENCOUNTER — Encounter: Payer: Self-pay | Admitting: Surgery

## 2018-04-11 VITALS — BP 117/64 | HR 67 | Temp 97.6°F | Ht 62.0 in | Wt 169.0 lb

## 2018-04-11 DIAGNOSIS — L732 Hidradenitis suppurativa: Secondary | ICD-10-CM

## 2018-04-11 NOTE — Patient Instructions (Signed)
We will refer you to [facility]. They will contact you and schedule your appointment.   Hidradenitis Suppurativa Hidradenitis suppurativa is a long-term (chronic) skin disease that starts with blocked sweat glands or hair follicles. Bacteria may grow in these blocked openings of your skin. Hidradenitis suppurativa is like a severe form of acne that develops in areas of your body where acne would be unusual. It is most likely to affect the areas of your body where skin rubs against skin and becomes moist. This includes your:  Underarms.  Groin.  Genital areas.  Buttocks.  Upper thighs.  Breasts.  Hidradenitis suppurativa may start out with small pimples. The pimples can develop into deep sores that break open (rupture) and drain pus. Over time your skin may thicken and become scarred. Hidradenitis suppurativa cannot be passed from person to person. What are the causes? The exact cause of hidradenitis suppurativa is not known. This condition may be due to:  Female and female hormones. The condition is rare before and after puberty.  An overactive body defense system (immune system). Your immune system may overreact to the blocked hair follicles or sweat glands and cause swelling and pus-filled sores.  What increases the risk? You may have a higher risk of hidradenitis suppurativa if you:  Are a woman.  Are between ages 26 and 40.  Have a family history of hidradenitis suppurativa.  Have a personal history of acne.  Are overweight.  Smoke.  Take the drug lithium.  What are the signs or symptoms? The first signs of an outbreak are usually painful skin bumps that look like pimples. As the condition progresses:  Skin bumps may get bigger and grow deeper into the skin.  Bumps under the skin may rupture and drain smelly pus.  Skin may become itchy and infected.  Skin may thicken and scar.  Drainage may continue through tunnels under the skin (fistulas).  Walking  and moving your arms can become painful.  How is this diagnosed? Your health care provider may diagnose hidradenitis suppurativa based on your medical history and your signs and symptoms. A physical exam will also be done. You may need to see a health care provider who specializes in skin diseases (dermatologist). You may also have tests done to confirm the diagnosis. These can include:  Swabbing a sample of pus or drainage from your skin so it can be sent to the lab and tested for infection.  Blood tests to check for infection.  How is this treated? The same treatment will not work for everybody with hidradenitis suppurativa. Your treatment will depend on how severe your symptoms are. You may need to try several treatments to find what works best for you. Part of your treatment may include cleaning and bandaging (dressing) your wounds. You may also have to take medicines, such as the following:  Antibiotics.  Acne medicines.  Medicines to block or suppress the immune system.  A diabetes medicine (metformin) is sometimes used to treat this condition.  For women, birth control pills can sometimes help relieve symptoms.  You may need surgery if you have a severe case of hidradenitis suppurativa that does not respond to medicine. Surgery may involve:  Using a laser to clear the skin and remove hair follicles.  Opening and draining deep sores.  Removing the areas of skin that are diseased and scarred.  Follow these instructions at home:  Learn as much as you can about your disease, and work closely with your health  care providers.  Take medicines only as directed by your health care provider.  If you were prescribed an antibiotic medicine, finish it all even if you start to feel better.  If you are overweight, losing weight may be very helpful. Try to reach and maintain a healthy weight.  Do not use any tobacco products, including cigarettes, chewing tobacco, or electronic  cigarettes. If you need help quitting, ask your health care provider.  Do not shave the areas where you get hidradenitis suppurativa.  Do not wear deodorant.  Wear loose-fitting clothes.  Try not to overheat and get sweaty.  Take a daily bleach bath as directed by your health care provider. ? Fill your bathtub halfway with water. ? Pour in  cup of unscented household bleach. ? Soak for 5-10 minutes.  Cover sore areas with a warm, clean washcloth (compress) for 5-10 minutes. Contact a health care provider if:  You have a flare-up of hidradenitis suppurativa.  You have chills or a fever.  You are having trouble controlling your symptoms at home. This information is not intended to replace advice given to you by your health care provider. Make sure you discuss any questions you have with your health care provider. Document Released: 05/16/2004 Document Revised: 03/09/2016 Document Reviewed: 01/02/2014 Elsevier Interactive Patient Education  2018 [employer]

## 2018-04-11 NOTE — Progress Notes (Signed)
Joyce MillerKeyonna A [name] is an 27 y.o. female.   Chief Complaint: Hidradenitis adenitis Consult requested by the emergency room physician. HPI: This patient with hidradenitis suppurativa.  She was 25 minutes late for her appointment. This patient has had long-standing hidradenitis upper teeth of both armpits.  She has had multiple I&D's in the past is not currently on antibiotics.  Her PCP puts her on antibiotics quite frequently however.  Her most recent event was at the beginning of the month.  She often gets fevers and chills as well.    Past Medical History:  Diagnosis Date  . Anemia   . Hidradenitis axillaris     Past Surgical History:  Procedure Laterality Date  . INCISION AND DRAINAGE BREAST ABSCESS Left 11/2014  . OTHER SURGICAL HISTORY N/A    had c section, blood transfusion    Family History  Problem Relation Age of Onset  . Migraines Mother   . Stroke Maternal Grandmother   . Cancer Maternal Grandfather 70       throat  . Cancer Paternal Grandmother    Social History:  reports that she has never smoked. She has never used smokeless tobacco. She reports that she drinks alcohol. She reports that she has current or past drug history.  Allergies:  Allergies  Allergen Reactions  . Sulfa Antibiotics Hives     (Not in a hospital admission)   Review of Systems:   Review of Systems  Constitutional: Positive for chills and fever.  HENT: Negative.   Eyes: Negative.   Respiratory: Negative.   Cardiovascular: Negative.   Gastrointestinal: Negative for heartburn, nausea and vomiting.  Musculoskeletal: Negative.     Physical Exam:  Physical Exam  Constitutional: She appears well-developed and well-nourished.  HENT:  Head: Normocephalic and atraumatic.  Eyes: Right eye exhibits no discharge. Left eye exhibits no discharge. No scleral icterus.  Pulmonary/Chest: Effort normal. No respiratory distress.  Musculoskeletal: Normal range of motion. She exhibits no edema or  deformity.  Skin: Skin is warm and dry. She is not diaphoretic. No erythema.  Multiple scars and areas of induration in both axilla.  No drainable pull pus.  Vitals reviewed.   Last menstrual period 03/18/2018.    No results found for this or any previous visit (from the past 48 hour(s)). No results found.   Assessment/Plan HS. this patient with long-standing hidradenitis separative of both axilla.  She has been off and on antibiotics and has had multiple incision and drainages.  [facility] for evaluation of excision and grafting.  Joyce Hawichard E Winter Jocelyn, MD, FACS

## 2018-04-30 ENCOUNTER — Telehealth: Payer: Self-pay | Admitting: Surgery

## 2018-04-30 NOTE — Telephone Encounter (Signed)
[facility]. [facility] would not see the patient for this, they sent the patient to dermatology.   [facility] [place] has reached out to the patient. See below per e-mail that was sent to me.      I called the patient and informed her that her Medicaid was family planning only and that we would be happy to make her appointment and schedule her an appointment with financial counseling.  She would be considered self-pay until it was approved.  She said she was going to contact her caseworker and try to get the full benefit of her Medicaid and would contact your office and our office if she was able to get it.  I sent this over on a fax earlier today.  Hyman Bowerhanx, Karen.

## 2018-05-01 ENCOUNTER — Telehealth: Payer: Self-pay

## 2018-05-01 NOTE — Telephone Encounter (Signed)
Error

## 2018-08-22 ENCOUNTER — Other Ambulatory Visit (HOSPITAL_COMMUNITY): Payer: Self-pay | Admitting: Family Medicine

## 2018-08-22 ENCOUNTER — Other Ambulatory Visit: Payer: Self-pay | Admitting: Family Medicine

## 2018-08-22 DIAGNOSIS — Z3401 Encounter for supervision of normal first pregnancy, first trimester: Secondary | ICD-10-CM

## 2018-08-27 ENCOUNTER — Ambulatory Visit
Admission: RE | Admit: 2018-08-27 | Discharge: 2018-08-27 | Disposition: A | Discharge status: Home / Self Care | Payer: Self-pay | Source: Ambulatory Visit | Attending: Family Medicine | Admitting: Family Medicine

## 2018-08-27 DIAGNOSIS — Z3401 Encounter for supervision of normal first pregnancy, first trimester: Secondary | ICD-10-CM | POA: Insufficient documentation

## 2018-09-14 ENCOUNTER — Emergency Department
Admission: EM | Admit: 2018-09-14 | Discharge: 2018-09-14 | Disposition: A | Discharge status: Home / Self Care | Payer: Self-pay | Attending: Emergency Medicine | Admitting: Emergency Medicine

## 2018-09-14 ENCOUNTER — Emergency Department: Payer: Self-pay

## 2018-09-14 ENCOUNTER — Encounter: Payer: Self-pay | Admitting: Emergency Medicine

## 2018-09-14 ENCOUNTER — Other Ambulatory Visit: Payer: Self-pay

## 2018-09-14 DIAGNOSIS — R1030 Lower abdominal pain, unspecified: Secondary | ICD-10-CM | POA: Insufficient documentation

## 2018-09-14 DIAGNOSIS — O26891 Other specified pregnancy related conditions, first trimester: Secondary | ICD-10-CM | POA: Insufficient documentation

## 2018-09-14 DIAGNOSIS — O209 Hemorrhage in early pregnancy, unspecified: Secondary | ICD-10-CM | POA: Insufficient documentation

## 2018-09-14 DIAGNOSIS — Z3A10 10 weeks gestation of pregnancy: Secondary | ICD-10-CM | POA: Insufficient documentation

## 2018-09-14 DIAGNOSIS — Z5321 Procedure and treatment not carried out due to patient leaving prior to being seen by health care provider: Secondary | ICD-10-CM | POA: Insufficient documentation

## 2018-09-14 LAB — HCG, QUANTITATIVE, PREGNANCY: HCG, BETA CHAIN, QUANT, S: 151393 m[IU]/mL — AB (ref <5)

## 2018-09-14 NOTE — ED Notes (Signed)
Called for room, not in waiting room.  

## 2018-09-14 NOTE — ED Triage Notes (Signed)
Pt to ED via POV c/o vaginal bleeding and lower abd cramping that started this morning around 0800. Pt states that the bleeding has gotten heavier since it first started and she is now passing clots. Pt states that she is going through 1 pad per hour. Pt is approximately 10 weeks 2 days pregnant. Pt is G5P2.

## 2018-10-10 ENCOUNTER — Other Ambulatory Visit: Payer: Self-pay | Admitting: Family Medicine

## 2018-10-10 DIAGNOSIS — O209 Hemorrhage in early pregnancy, unspecified: Secondary | ICD-10-CM

## 2018-10-15 ENCOUNTER — Ambulatory Visit: Admission: RE | Admit: 2018-10-15 | Payer: Self-pay | Source: Ambulatory Visit

## 2018-10-16 NOTE — L&D Delivery Note (Signed)
Delivery Summary for Joyce [name]  Labor Events:   Preterm labor:   Rupture date:   Rupture time:   Rupture type:   Fluid Color:   Induction:   Augmentation:   Complications:   Cervical ripening:          Delivery:   Episiotomy:   Lacerations:   Repair suture:   Repair # of packets:   Blood loss (ml): 600   Information for the patient's newborn:  [place], [place] [[id number]]    Delivery 03/31/2019 10:57 AM by  C-Section, Low Transverse Sex:  female Gestational Age: [redacted]w[redacted]d Delivery Clinician:   Living?:         APGARS  One minute Five minutes Ten minutes  Skin color:        Heart rate:        Grimace:        Muscle tone:        Breathing:        Totals: 9  9      Presentation/position:      Resuscitation:   Cord information:    Disposition of cord blood:     Blood gases sent?  Complications:   Placenta: Delivered:       appearance Newborn Measurements: Weight: 7 lb 9 oz (3430 g)  Height:    Head circumference:    Chest circumference:    Other providers:    Additional  information: Forceps:   Vacuum:   Breech:   Observed anomalies        See Dr. Andreas Blower operative note for details of C-section procedure.   Rubie Maid, MD [facility]

## 2018-10-21 ENCOUNTER — Encounter: Payer: Self-pay | Admitting: *Deleted

## 2018-10-21 ENCOUNTER — Emergency Department
Admission: EM | Admit: 2018-10-21 | Discharge: 2018-10-21 | Disposition: A | Discharge status: Home / Self Care | Payer: Medicaid Other | Attending: Emergency Medicine | Admitting: Emergency Medicine

## 2018-10-21 DIAGNOSIS — Z331 Pregnant state, incidental: Secondary | ICD-10-CM | POA: Insufficient documentation

## 2018-10-21 DIAGNOSIS — L732 Hidradenitis suppurativa: Secondary | ICD-10-CM | POA: Diagnosis not present

## 2018-10-21 DIAGNOSIS — L089 Local infection of the skin and subcutaneous tissue, unspecified: Secondary | ICD-10-CM | POA: Diagnosis present

## 2018-10-21 MED ORDER — LIDOCAINE 5 % EX PTCH
1.0000 | MEDICATED_PATCH | CUTANEOUS | Status: DC
  Administered 2018-10-21: 1 via TRANSDERMAL
  Filled 2018-10-21 (×2): qty 1

## 2018-10-21 MED ORDER — CEFTRIAXONE SODIUM 1 G IJ SOLR
1.0000 g | Freq: Once | INTRAMUSCULAR | Status: AC
  Administered 2018-10-21: 1 g via INTRAVENOUS
  Filled 2018-10-21: qty 10

## 2018-10-21 MED ORDER — LIDOCAINE HCL (PF) 1 % IJ SOLN
5.0000 mL | Freq: Once | INTRAMUSCULAR | Status: AC
  Administered 2018-10-21: 5 mL
  Filled 2018-10-21: qty 5

## 2018-10-21 MED ORDER — CEPHALEXIN 500 MG PO CAPS: 500.0000 mg | ORAL_CAPSULE | Freq: Four times a day (QID) | ORAL | 0 refills | Status: AC

## 2018-10-21 NOTE — Discharge Instructions (Signed)
Take antibiotics as directed.  Contact the surgical department for evaluation of I&D of lesion.  Advised extra strength Tylenol as needed for pain.

## 2018-10-21 NOTE — ED Triage Notes (Signed)
Pt is here for boil in left axilla.  Pt states that she has hx of this but this time it is more painful and swollen than in the past.  This began on about 10 days ago.  Some drainage noted.  Pt states that she is [redacted] weeks pregnant. Due 03/22/19

## 2018-10-21 NOTE — ED Provider Notes (Signed)
[facility] Emergency Department Provider Note   ____________________________________________   First MD Initiated Contact with Patient 10/21/18 1026     (approximate)  I have reviewed the triage vital signs and the nursing notes.   HISTORY  Chief Complaint Recurrent Skin Infections    HPI Joyce MillerKeyonna A [name] is a 28 y.o. female patient presents for abscess to left axillary area.  Patient stated started developing 10 days ago has noticed some drainage today.  Patient pain is increased she is concerned because she is [redacted] weeks gestation.  Patient rates pain as a 10/10.  Patient described pain as "achy".  No palliative measure for complaint.  History of Hidradenitis.  Past Medical History:  Diagnosis Date  . Anemia   . Hidradenitis axillaris     Patient Active Problem List   Diagnosis Date Noted  . Hidradenitis axillaris 08/31/2015  . Anemia   . Hidradenitis 12/26/2013    Past Surgical History:  Procedure Laterality Date  . INCISION AND DRAINAGE BREAST ABSCESS Left 11/2014  . OTHER SURGICAL HISTORY N/A    had c section, blood transfusion    Prior to Admission medications   Medication Sig Start Date End Date Taking? Authorizing Provider  cephALEXin (KEFLEX) 500 MG capsule Take 1 capsule (500 mg total) by mouth 4 (four) times daily for 10 days. 10/21/18 10/31/18  Joni ReiningSmith, Ronald K, PA-C    Allergies Sulfa antibiotics  Family History  Problem Relation Age of Onset  . Migraines Mother   . Stroke Maternal Grandmother   . Cancer Maternal Grandfather 70       throat  . Cancer Paternal Grandmother     Social History Social History   Tobacco Use  . Smoking status: Never Smoker  . Smokeless tobacco: Never Used  Substance Use Topics  . Alcohol use: Yes    Alcohol/week: 0.0 standard drinks    Comment: occa  . Drug use: Yes    Types: Marijuana    Review of Systems Constitutional: No fever/chills Eyes: No visual changes. ENT: No sore  throat. Cardiovascular: Denies chest pain. Respiratory: Denies shortness of breath. Gastrointestinal: No abdominal pain.  No nausea, no vomiting.  No diarrhea.  No constipation. Genitourinary: Negative for dysuria. Musculoskeletal: Negative for back pain. Skin: Negative for rash.Hidradenitis. neurological: Negative for headaches, focal weakness or numbness. Hematological/Lymphatic:Iron deficiency anemia. Allergic/Immunilogical: Sulfa ____________________________________________   PHYSICAL EXAM:  VITAL SIGNS: ED Triage Vitals [10/21/18 1018]  Enc Vitals Group     BP 129/68     Pulse Rate 91     Resp 20     Temp 98.9 F (37.2 C)     Temp Source Oral     SpO2 99 %     Weight 170 lb (77.1 kg)     Height 5\' 1"  (1.549 m)     Head Circumference      Peak Flow      Pain Score 10     Pain Loc      Pain Edu?      Excl. in GC?    Constitutional: Alert and oriented.  Moderate distress.  Cardiovascular: Normal rate, regular rhythm. Grossly normal heart sounds.  Good peripheral circulation. Respiratory: Normal respiratory effort.  No retractions. Lungs CTAB. Skin:  Skin is warm, dry and intact.  Bullous lesion left axillary area.   Psychiatric: Mood and affect are normal. Speech and behavior are normal.  ____________________________________________   LABS (all labs ordered are listed, but only abnormal results are displayed)  Labs Reviewed - No data to display ____________________________________________  EKG   ____________________________________________  RADIOLOGY  ED MD interpretation:    Official radiology report(s): No results found.  ____________________________________________   PROCEDURES  Procedure(s) performed: None  .Marland KitchenIncision and Drainage Date/Time: 10/21/2018 1:56 PM Performed by: Joni Reining, PA-C Authorized by: Joni Reining, PA-C   Consent:    Consent obtained:  Verbal   Consent given by:  Patient   Risks discussed:  Bleeding,  incomplete drainage, pain and infection Location:    Type:  Abscess   Location:  Upper extremity   Upper extremity location:  Arm   Arm location:  L upper arm Pre-procedure details:    Skin preparation:  Betadine Anesthesia (see MAR for exact dosages):    Anesthesia method:  Topical application and local infiltration   Local anesthetic:  Lidocaine 1% w/o epi Procedure type:    Complexity:  Complex Procedure details:    Incision types:  Stab incision   Incision depth:  Dermal   Scalpel blade:  11   Drainage:  Purulent and bloody   Drainage amount:  Scant   Wound treatment:  Wound left open   Packing materials:  None Post-procedure details:    Patient tolerance of procedure:  Tolerated with difficulty    Critical Care performed: No  ____________________________________________   INITIAL IMPRESSION / ASSESSMENT AND PLAN / ED COURSE  As part of my medical decision making, I reviewed the following data within the electronic MEDICAL RECORD NUMBER    Patient presents with abscess to the left axillary area.  Patient is a minimal to incision and drainage.  Anesthesia was not achieved with local infiltration.  Scant amount of purulent material was expressed.  Patient requests procedure be stopped.  Patient given IV Rocephin and a prescription for Keflex.  Patient will follow-up by contacting the surgical clinic for evaluation and treatment.  Advised extra strength Tylenol and lidocaine patch until seen by the surgical clinic.     ____________________________________________   FINAL CLINICAL IMPRESSION(S) / ED DIAGNOSES  Final diagnoses:  Hidradenitis suppurativa of left axilla     ED Discharge Orders         Ordered    cephALEXin (KEFLEX) 500 MG capsule  4 times daily     10/21/18 1353           Note:  This document was prepared using Dragon voice recognition software and may include unintentional dictation errors.    Joni Reining, PA-C 10/21/18 1400      Sharman Cheek, MD 10/28/18 [phone]

## 2018-10-28 ENCOUNTER — Ambulatory Visit: Payer: Medicaid Other | Admitting: General Surgery

## 2018-11-18 ENCOUNTER — Encounter: Payer: Self-pay | Admitting: Certified Nurse Midwife

## 2018-11-18 ENCOUNTER — Ambulatory Visit (INDEPENDENT_AMBULATORY_CARE_PROVIDER_SITE_OTHER): Payer: Medicaid Other | Admitting: Certified Nurse Midwife

## 2018-11-18 VITALS — BP 103/63 | HR 71 | Wt 177.6 lb

## 2018-11-18 DIAGNOSIS — O219 Vomiting of pregnancy, unspecified: Secondary | ICD-10-CM

## 2018-11-18 DIAGNOSIS — O99619 Diseases of the digestive system complicating pregnancy, unspecified trimester: Secondary | ICD-10-CM

## 2018-11-18 DIAGNOSIS — Z3A19 19 weeks gestation of pregnancy: Secondary | ICD-10-CM | POA: Diagnosis not present

## 2018-11-18 DIAGNOSIS — O99612 Diseases of the digestive system complicating pregnancy, second trimester: Secondary | ICD-10-CM

## 2018-11-18 DIAGNOSIS — N912 Amenorrhea, unspecified: Secondary | ICD-10-CM

## 2018-11-18 DIAGNOSIS — Z3201 Encounter for pregnancy test, result positive: Secondary | ICD-10-CM

## 2018-11-18 DIAGNOSIS — Z6831 Body mass index (BMI) 31.0-31.9, adult: Secondary | ICD-10-CM

## 2018-11-18 DIAGNOSIS — K219 Gastro-esophageal reflux disease without esophagitis: Secondary | ICD-10-CM | POA: Insufficient documentation

## 2018-11-18 DIAGNOSIS — Z130 Encounter for screening for diseases of the blood and blood-forming organs and certain disorders involving the immune mechanism: Secondary | ICD-10-CM

## 2018-11-18 DIAGNOSIS — Z3482 Encounter for supervision of other normal pregnancy, second trimester: Secondary | ICD-10-CM

## 2018-11-18 DIAGNOSIS — Z0283 Encounter for blood-alcohol and blood-drug test: Secondary | ICD-10-CM

## 2018-11-18 DIAGNOSIS — O0930 Supervision of pregnancy with insufficient antenatal care, unspecified trimester: Secondary | ICD-10-CM | POA: Insufficient documentation

## 2018-11-18 DIAGNOSIS — O0932 Supervision of pregnancy with insufficient antenatal care, second trimester: Secondary | ICD-10-CM

## 2018-11-18 DIAGNOSIS — Z113 Encounter for screening for infections with a predominantly sexual mode of transmission: Secondary | ICD-10-CM

## 2018-11-18 LAB — POCT URINE PREGNANCY: PREG TEST UR: POSITIVE — AB

## 2018-11-18 MED ORDER — PANTOPRAZOLE SODIUM 20 MG PO TBEC: 20.0000 mg | DELAYED_RELEASE_TABLET | Freq: Every day | ORAL | 3 refills | Status: DC

## 2018-11-18 MED ORDER — CITRANATAL BLOOM 90-1 MG PO TABS: 1.0000 | ORAL_TABLET | Freq: Every day | ORAL | 4 refills | Status: AC

## 2018-11-18 MED ORDER — DOXYLAMINE-PYRIDOXINE 10-10 MG PO TBEC: 2.0000 | DELAYED_RELEASE_TABLET | Freq: Every day | ORAL | 5 refills | Status: DC

## 2018-11-18 NOTE — Patient Instructions (Addendum)
Commonly Asked Questions During Pregnancy  Cats: A parasite can be excreted in cat feces.  To avoid exposure you need to have another person empty the little box.  If you must empty the litter box you will need to wear gloves.  Wash your hands after handling your cat.  This parasite can also be found in raw or undercooked meat so this should also be avoided.  Colds, Sore Throats, Flu: Please check your medication sheet to see what you can take for symptoms.  If your symptoms are unrelieved by these medications please call the office.  Dental Work: Most any dental work your dentist recommends is permitted.  X-rays should only be taken during the first trimester if absolutely necessary.  Your abdomen should be shielded with a lead apron during all x-rays.  Please notify your provider prior to receiving any x-rays.  Novocaine is fine; gas is not recommended.  If your dentist requires a note from us prior to dental work please call the office and we will provide one for you.  Exercise: Exercise is an important part of staying healthy during your pregnancy.  You may continue most exercises you were accustomed to prior to pregnancy.  Later in your pregnancy you will most likely notice you have difficulty with activities requiring balance like riding a bicycle.  It is important that you listen to your body and avoid activities that put you at a higher risk of falling.  Adequate rest and staying well hydrated are a must!  If you have questions about the safety of specific activities ask your provider.    Exposure to Children with illness: Try to avoid obvious exposure; report any symptoms to us when noted,  If you have chicken pos, red measles or mumps, you should be immune to these diseases.   Please do not take any vaccines while pregnant unless you have checked with your OB provider.  Fetal Movement: After 28 weeks we recommend you do "kick counts" twice daily.  Lie or sit down in a calm quiet environment and  count your baby movements "kicks".  You should feel your baby at least 10 times per hour.  If you have not felt 10 kicks within the first hour get up, walk around and have something sweet to eat or drink then repeat for an additional hour.  If count remains less than 10 per hour notify your provider.  Fumigating: Follow your pest control agent's advice as to how long to stay out of your home.  Ventilate the area well before re-entering.  Hemorrhoids:   Most over-the-counter preparations can be used during pregnancy.  Check your medication to see what is safe to use.  It is important to use a stool softener or fiber in your diet and to drink lots of liquids.  If hemorrhoids seem to be getting worse please call the office.   Hot Tubs:  Hot tubs Jacuzzis and saunas are not recommended while pregnant.  These increase your internal body temperature and should be avoided.  Intercourse:  Sexual intercourse is safe during pregnancy as long as you are comfortable, unless otherwise advised by your provider.  Spotting may occur after intercourse; report any bright red bleeding that is heavier than spotting.  Labor:  If you know that you are in labor, please go to the hospital.  If you are unsure, please call the office and let us help you decide what to do.  Lifting, straining, etc:  If your job requires heavy   lifting or straining please check with your provider for any limitations.  Generally, you should not lift items heavier than that you can lift simply with your hands and arms (no back muscles)  Painting:  Paint fumes do not harm your pregnancy, but may make you ill and should be avoided if possible.  Latex or water based paints have less odor than oils.  Use adequate ventilation while painting.  Permanents & Hair Color:  Chemicals in hair dyes are not recommended as they cause increase hair dryness which can increase hair loss during pregnancy.  " Highlighting" and permanents are allowed.  Dye may be  absorbed differently and permanents may not hold as well during pregnancy.  Sunbathing:  Use a sunscreen, as skin burns easily during pregnancy.  Drink plenty of fluids; avoid over heating.  Tanning Beds:  Because their possible side effects are still unknown, tanning beds are not recommended.  Ultrasound Scans:  Routine ultrasounds are performed at approximately 20 weeks.  You will be able to see your baby's general anatomy an if you would like to know the gender this can usually be determined as well.  If it is questionable when you conceived you may also receive an ultrasound early in your pregnancy for dating purposes.  Otherwise ultrasound exams are not routinely performed unless there is a medical necessity.  Although you can request a scan we ask that you pay for it when conducted because insurance does not cover " patient request" scans.  Work: If your pregnancy proceeds without complications you may work until your due date, unless your physician or employer advises otherwise.  Round Ligament Pain/Pelvic Discomfort:  Sharp, shooting pains not associated with bleeding are fairly common, usually occurring in the second trimester of pregnancy.  They tend to be worse when standing up or when you remain standing for long periods of time.  These are the result of pressure of certain pelvic ligaments called "round ligaments".  Rest, Tylenol and heat seem to be the most effective relief.  As the womb and fetus grow, they rise out of the pelvis and the discomfort improves.  Please notify the office if your pain seems different than that described.  It may represent a more serious condition.    How a Baby Grows During Pregnancy  Pregnancy begins when a female's sperm enters a female's egg (fertilization). Fertilization usually happens in one of the tubes (fallopian tubes) that connect the ovaries to the womb (uterus). The fertilized egg moves down the fallopian tube to the uterus. Once it reaches the  uterus, it implants into the lining of the uterus and begins to grow. For the first 10 weeks, the fertilized egg is called an embryo. After 10 weeks, it is called a fetus. As the fetus continues to grow, it receives oxygen and nutrients through tissue (placenta) that grows to support the developing baby. The placenta is the life support system for the baby. It provides oxygen and nutrition and removes waste. Learning as much as you can about your pregnancy and how your baby is developing can help you enjoy the experience. It can also make you aware of when there might be a problem and when to ask questions. How long does a typical pregnancy last? A pregnancy usually lasts 280 days, or about 40 weeks. Pregnancy is divided into three periods of growth, also called trimesters:  First trimester: 0-12 weeks.  Second trimester: 13-27 weeks.  Third trimester: 28-40 weeks. The day when your baby is  ready to be born (full term) is your estimated date of delivery. How does my baby develop month by month? First month  The fertilized egg attaches to the inside of the uterus.  Some cells will form the placenta. Others will form the fetus.  The arms, legs, brain, spinal cord, lungs, and heart begin to develop.  At the end of the first month, the heart begins to beat. Second month  The bones, inner ear, eyelids, hands, and feet form.  The genitals develop.  By the end of 8 weeks, all major organs are developing. Third month  All of the internal organs are forming.  Teeth develop below the gums.  Bones and muscles begin to grow. The spine can flex.  The skin is transparent.  Fingernails and toenails begin to form.  Arms and legs continue to grow longer, and hands and feet develop.  The fetus is about 3 inches (7.6 cm) long. Fourth month  The placenta is completely formed.  The external sex organs, neck, outer ear, eyebrows, eyelids, and fingernails are formed.  The fetus can hear,  swallow, and move its arms and legs.  The kidneys begin to produce urine.  The skin is covered with a white, waxy coating (vernix) and very fine hair (lanugo). Fifth month  The fetus moves around more and can be felt for the first time (quickening).  The fetus starts to sleep and wake up and may begin to suck its finger.  The nails grow to the end of the fingers.  The organ in the digestive system that makes bile (gallbladder) functions and helps to digest nutrients.  If your baby is a girl, eggs are present in her ovaries. If your baby is a boy, testicles start to move down into his scrotum. Sixth month  The lungs are formed.  The eyes open. The brain continues to develop.  Your baby has fingerprints and toe prints. Your baby's hair grows thicker.  At the end of the second trimester, the fetus is about 9 inches (22.9 cm) long. Seventh month  The fetus kicks and stretches.  The eyes are developed enough to sense changes in light.  The hands can make a grasping motion.  The fetus responds to sound. Eighth month  All organs and body systems are fully developed and functioning.  Bones harden, and taste buds develop. The fetus may hiccup.  Certain areas of the brain are still developing. The skull remains soft. Ninth month  The fetus gains about  lb (0.23 kg) each week.  The lungs are fully developed.  Patterns of sleep develop.  The fetus's head typically moves into a head-down position (vertex) in the uterus to prepare for birth.  The fetus weighs 6-9 lb (2.72-4.08 kg) and is 19-20 inches (48.26-50.8 cm) long. What can I do to have a healthy pregnancy and help my baby develop? General instructions  Take prenatal vitamins as directed by your health care provider. These include vitamins such as folic acid, iron, calcium, and vitamin D. They are important for healthy development.  Take medicines only as directed by your health care provider. Read labels and ask a  pharmacist or your health care provider whether over-the-counter medicines, supplements, and prescription drugs are safe to take during pregnancy.  Keep all follow-up visits as directed by your health care provider. This is important. Follow-up visits include prenatal care and screening tests. How do I know if my baby is developing well? At each prenatal visit, your health care  provider will do several different tests to check on your health and keep track of your baby's development. These include:  Fundal height and position. ? Your health care provider will measure your growing belly from your pubic bone to the top of the uterus using a tape measure. ? Your health care provider will also feel your belly to determine your baby's position.  Heartbeat. ? An ultrasound in the first trimester can confirm pregnancy and show a heartbeat, depending on how far along you are. ? Your health care provider will check your baby's heart rate at every prenatal visit.  Second trimester ultrasound. ? This ultrasound checks your baby's development. It also may show your baby's gender. What should I do if I have concerns about my baby's development? Always talk with your health care provider about any concerns that you may have about your pregnancy and your baby. Summary  A pregnancy usually lasts 280 days, or about 40 weeks. Pregnancy is divided into three periods of growth, also called trimesters.  Your health care provider will monitor your baby's growth and development throughout your pregnancy.  Follow your health care provider's recommendations about taking prenatal vitamins and medicines during your pregnancy.  Talk with your health care provider if you have any concerns about your pregnancy or your developing baby. This information is not intended to replace advice given to you by your health care provider. Make sure you discuss any questions you have with your health care provider. Document  Released: 03/20/2008 Document Revised: 08/15/2017 Document Reviewed: 08/15/2017 Elsevier Interactive Patient Education  2019 [employer].  Breastfeeding  Choosing to breastfeed is one of the best decisions you can make for yourself and your baby. A change in hormones during pregnancy causes your breasts to make breast milk in your milk-producing glands. Hormones prevent breast milk from being released before your baby is born. They also prompt milk flow after birth. Once breastfeeding has begun, thoughts of your baby, as well as his or her sucking or crying, can stimulate the release of milk from your milk-producing glands. Benefits of breastfeeding Research shows that breastfeeding offers many health benefits for infants and mothers. It also offers a cost-free and convenient way to feed your baby. For your baby  Your first milk (colostrum) helps your baby's digestive system to function better.  Special cells in your milk (antibodies) help your baby to fight off infections.  Breastfed babies are less likely to develop asthma, allergies, obesity, or type 2 diabetes. They are also at lower risk for sudden infant death syndrome (SIDS).  Nutrients in breast milk are better able to meet your baby's needs compared to infant formula.  Breast milk improves your baby's brain development. For you  Breastfeeding helps to create a very special bond between you and your baby.  Breastfeeding is convenient. Breast milk costs nothing and is always available at the correct temperature.  Breastfeeding helps to burn calories. It helps you to lose the weight that you gained during pregnancy.  Breastfeeding makes your uterus return faster to its size before pregnancy. It also slows bleeding (lochia) after you give birth.  Breastfeeding helps to lower your risk of developing type 2 diabetes, osteoporosis, rheumatoid arthritis, cardiovascular disease, and breast, ovarian, uterine, and endometrial cancer  later in life. Breastfeeding basics Starting breastfeeding  Find a comfortable place to sit or lie down, with your neck and back well-supported.  Place a pillow or a rolled-up blanket under your baby to bring him or her to  the level of your breast (if you are seated). Nursing pillows are specially designed to help support your arms and your baby while you breastfeed.  Make sure that your baby's tummy (abdomen) is facing your abdomen.  Gently massage your breast. With your fingertips, massage from the outer edges of your breast inward toward the nipple. This encourages milk flow. If your milk flows slowly, you may need to continue this action during the feeding.  Support your breast with 4 fingers underneath and your thumb above your nipple (make the letter "C" with your hand). Make sure your fingers are well away from your nipple and your baby's mouth.  Stroke your baby's lips gently with your finger or nipple.  When your baby's mouth is open wide enough, quickly bring your baby to your breast, placing your entire nipple and as much of the areola as possible into your baby's mouth. The areola is the colored area around your nipple. ? More areola should be visible above your baby's upper lip than below the lower lip. ? Your baby's lips should be opened and extended outward (flanged) to ensure an adequate, comfortable latch. ? Your baby's tongue should be between his or her lower gum and your breast.  Make sure that your baby's mouth is correctly positioned around your nipple (latched). Your baby's lips should create a seal on your breast and be turned out (everted).  It is common for your baby to suck about 2-3 minutes in order to start the flow of breast milk. Latching Teaching your baby how to latch onto your breast properly is very important. An improper latch can cause nipple pain, decreased milk supply, and poor weight gain in your baby. Also, if your baby is not latched onto your nipple  properly, he or she may swallow some air during feeding. This can make your baby fussy. Burping your baby when you switch breasts during the feeding can help to get rid of the air. However, teaching your baby to latch on properly is still the best way to prevent fussiness from swallowing air while breastfeeding. Signs that your baby has successfully latched onto your nipple  Silent tugging or silent sucking, without causing you pain. Infant's lips should be extended outward (flanged).  Swallowing heard between every 3-4 sucks once your milk has started to flow (after your let-down milk reflex occurs).  Muscle movement above and in front of his or her ears while sucking. Signs that your baby has not successfully latched onto your nipple  Sucking sounds or smacking sounds from your baby while breastfeeding.  Nipple pain. If you think your baby has not latched on correctly, slip your finger into the corner of your baby's mouth to break the suction and place it between your baby's gums. Attempt to start breastfeeding again. Signs of successful breastfeeding Signs from your baby  Your baby will gradually decrease the number of sucks or will completely stop sucking.  Your baby will fall asleep.  Your baby's body will relax.  Your baby will retain a small amount of milk in his or her mouth.  Your baby will let go of your breast by himself or herself. Signs from you  Breasts that have increased in firmness, weight, and size 1-3 hours after feeding.  Breasts that are softer immediately after breastfeeding.  Increased milk volume, as well as a change in milk consistency and color by the fifth day of breastfeeding.  Nipples that are not sore, cracked, or bleeding. Signs that your  baby is getting enough milk  Wetting at least 1-2 diapers during the first 24 hours after birth.  Wetting at least 5-6 diapers every 24 hours for the first week after birth. The urine should be clear or pale  yellow by the age of 5 days.  Wetting 6-8 diapers every 24 hours as your baby continues to grow and develop.  At least 3 stools in a 24-hour period by the age of 5 days. The stool should be soft and yellow.  At least 3 stools in a 24-hour period by the age of 7 days. The stool should be seedy and yellow.  No loss of weight greater than 10% of birth weight during the first 3 days of life.  Average weight gain of 4-7 oz (113-198 g) per week after the age of 4 days.  Consistent daily weight gain by the age of 5 days, without weight loss after the age of 2 weeks. After a feeding, your baby may spit up a small amount of milk. This is normal. Breastfeeding frequency and duration Frequent feeding will help you make more milk and can prevent sore nipples and extremely full breasts (breast engorgement). Breastfeed when you feel the need to reduce the fullness of your breasts or when your baby shows signs of hunger. This is called "breastfeeding on demand." Signs that your baby is hungry include:  Increased alertness, activity, or restlessness.  Movement of the head from side to side.  Opening of the mouth when the corner of the mouth or cheek is stroked (rooting).  Increased sucking sounds, smacking lips, cooing, sighing, or squeaking.  Hand-to-mouth movements and sucking on fingers or hands.  Fussing or crying. Avoid introducing a pacifier to your baby in the first 4-6 weeks after your baby is born. After this time, you may choose to use a pacifier. Research has shown that pacifier use during the first year of a baby's life decreases the risk of sudden infant death syndrome (SIDS). Allow your baby to feed on each breast as long as he or she wants. When your baby unlatches or falls asleep while feeding from the first breast, offer the second breast. Because newborns are often sleepy in the first few weeks of life, you may need to awaken your baby to get him or her to feed. Breastfeeding times  will vary from baby to baby. However, the following rules can serve as a guide to help you make sure that your baby is properly fed:  Newborns (babies 184 weeks of age or younger) may breastfeed every 1-3 hours.  Newborns should not go without breastfeeding for longer than 3 hours during the day or 5 hours during the night.  You should breastfeed your baby a minimum of 8 times in a 24-hour period. Breast milk pumping     Pumping and storing breast milk allows you to make sure that your baby is exclusively fed your breast milk, even at times when you are unable to breastfeed. This is especially important if you go back to work while you are still breastfeeding, or if you are not able to be present during feedings. Your lactation consultant can help you find a method of pumping that works best for you and give you guidelines about how long it is safe to store breast milk. Caring for your breasts while you breastfeed Nipples can become dry, cracked, and sore while breastfeeding. The following recommendations can help keep your breasts moisturized and healthy:  Avoid using soap on your  nipples.  Wear a supportive bra designed especially for nursing. Avoid wearing underwire-style bras or extremely tight bras (sports bras).  Air-dry your nipples for 3-4 minutes after each feeding.  Use only cotton bra pads to absorb leaked breast milk. Leaking of breast milk between feedings is normal.  Use lanolin on your nipples after breastfeeding. Lanolin helps to maintain your skin's normal moisture barrier. Pure lanolin is not harmful (not toxic) to your baby. You may also hand express a few drops of breast milk and gently massage that milk into your nipples and allow the milk to air-dry. In the first few weeks after giving birth, some women experience breast engorgement. Engorgement can make your breasts feel heavy, warm, and tender to the touch. Engorgement peaks within 3-5 days after you give birth. The  following recommendations can help to ease engorgement:  Completely empty your breasts while breastfeeding or pumping. You may want to start by applying warm, moist heat (in the shower or with warm, water-soaked hand towels) just before feeding or pumping. This increases circulation and helps the milk flow. If your baby does not completely empty your breasts while breastfeeding, pump any extra milk after he or she is finished.  Apply ice packs to your breasts immediately after breastfeeding or pumping, unless this is too uncomfortable for you. To do this: ? Put ice in a plastic bag. ? Place a towel between your skin and the bag. ? Leave the ice on for 20 minutes, 2-3 times a day.  Make sure that your baby is latched on and positioned properly while breastfeeding. If engorgement persists after 48 hours of following these recommendations, contact your health care provider or a Advertising copywriter. Overall health care recommendations while breastfeeding  Eat 3 healthy meals and 3 snacks every day. Well-nourished mothers who are breastfeeding need an additional 450-500 calories a day. You can meet this requirement by increasing the amount of a balanced diet that you eat.  Drink enough water to keep your urine pale yellow or clear.  Rest often, relax, and continue to take your prenatal vitamins to prevent fatigue, stress, and low vitamin and mineral levels in your body (nutrient deficiencies).  Do not use any products that contain nicotine or tobacco, such as cigarettes and e-cigarettes. Your baby may be harmed by chemicals from cigarettes that pass into breast milk and exposure to secondhand smoke. If you need help quitting, ask your health care provider.  Avoid alcohol.  Do not use illegal drugs or marijuana.  Talk with your health care provider before taking any medicines. These include over-the-counter and prescription medicines as well as vitamins and herbal supplements. Some medicines that  may be harmful to your baby can pass through breast milk.  It is possible to become pregnant while breastfeeding. If birth control is desired, ask your health care provider about options that will be safe while breastfeeding your baby. Where to find more information: [employer]: www.llli.org Contact a health care provider if:  You feel like you want to stop breastfeeding or have become frustrated with breastfeeding.  Your nipples are cracked or bleeding.  Your breasts are red, tender, or warm.  You have: ? Painful breasts or nipples. ? A swollen area on either breast. ? A fever or chills. ? Nausea or vomiting. ? Drainage other than breast milk from your nipples.  Your breasts do not become full before feedings by the fifth day after you give birth.  You feel sad and depressed.  Your baby is: ? Too sleepy to eat well. ? Having trouble sleeping. ? More than 6 week old and wetting fewer than 6 diapers in a 24-hour period. ? Not gaining weight by 38 days of age.  Your baby has fewer than 3 stools in a 24-hour period.  Your baby's skin or the white parts of his or her eyes become yellow. Get help right away if:  Your baby is overly tired (lethargic) and does not want to wake up and feed.  Your baby develops an unexplained fever. Summary  Breastfeeding offers many health benefits for infant and mothers.  Try to breastfeed your infant when he or she shows early signs of hunger.  Gently tickle or stroke your baby's lips with your finger or nipple to allow the baby to open his or her mouth. Bring the baby to your breast. Make sure that much of the areola is in your baby's mouth. Offer one side and burp the baby before you offer the other side.  Talk with your health care provider or lactation consultant if you have questions or you face problems as you breastfeed. This information is not intended to replace advice given to you by your health care provider. Make  sure you discuss any questions you have with your health care provider. Document Released: 10/02/2005 Document Revised: 11/03/2016 Document Reviewed: 11/03/2016 Elsevier Interactive Patient Education  2019 [employer].  Vaginal Birth After Cesarean Delivery  Vaginal birth after cesarean delivery (VBAC) is giving birth vaginally after previously delivering a baby through a cesarean section (C-section). A VBAC may be a safe option for you, depending on your health and other factors. It is important to discuss VBAC with your health care provider early in your pregnancy so you can understand the risks, benefits, and options. Having these discussions early will give you time to make your birth plan. Who are the best candidates for VBAC? The best candidates for VBAC are women who:  Have had one or two prior cesarean deliveries, and the incision made during the delivery was horizontal (low transverse).  Do not have a vertical (classical) scar on their uterus.  Have not had a tear in the wall of their uterus (uterine rupture).  Plan to have more pregnancies. A VBAC is also more likely to be successful:  In women who have previously given birth vaginally.  When labor starts by itself (spontaneously) before the due date. What are the benefits of VBAC? The benefits of delivering your baby vaginally instead of by a cesarean delivery include:  A shorter hospital stay.  A faster recovery time.  Less pain.  Avoiding risks associated with major surgery, such as infection and blood clots.  Less blood loss and less need for donated blood (transfusions). What are the risks of VBAC? The main risk of attempting a VBAC is that it may fail, forcing your health care provider to deliver your baby by a C-section. Other risks are rare and include:  Tearing (rupture) of the scar from a past cesarean delivery.  Other risks associated with vaginal deliveries. If a repeat cesarean delivery is needed,  the risks include:  Blood loss.  Infection.  Blood clot.  Damage to surrounding organs.  Removal of the uterus (hysterectomy), if it is damaged.  Placenta problems in future pregnancies. What else should I know about my options? Delivering a baby through a VBAC is similar to having a normal spontaneous vaginal delivery. Therefore, it is safe:  To try with twins.  For your health care provider to try to turn the baby from a breech position (external cephalic version) during labor.  With epidural analgesia for pain relief. Consider where you would like to deliver your baby. VBAC should be attempted in facilities where an emergency cesarean delivery can be performed. VBAC is not recommended for home births. Any changes in your health or your baby's health during your pregnancy may make it necessary to change your initial decision about VBAC. Your health care provider may recommend that you do not attempt a VBAC if:  Your baby's suspected weight is 8.8 lb (4 kg) or more.  You have preeclampsia. This is a condition that causes high blood pressure along with other symptoms, such as swelling and headaches.  You will have VBAC less than 19 months after your cesarean delivery.  You are past your due date.  You need to have labor started (induced) because your cervix is not ready for labor (unfavorable). Where to find more information  American Pregnancy Association: americanpregnancy.org  [employer]: acog.org Summary  Vaginal birth after cesarean delivery (VBAC) is giving birth vaginally after previously delivering a baby through a cesarean section (C-section). A VBAC may be a safe option for you, depending on your health and other factors.  Discuss VBAC with your health care provider early in your pregnancy so you can understand the risks, benefits, options, and have plenty of time to make your birth plan.  The main risk of attempting a  VBAC is that it may fail, forcing your health care provider to deliver your baby by a C-section. Other risks are rare. This information is not intended to replace advice given to you by your health care provider. Make sure you discuss any questions you have with your health care provider. Document Released: 03/25/2007 Document Revised: 01/09/2017 Document Reviewed: 01/09/2017 Elsevier Interactive Patient Education  2019 Elsevier Inc. WHAT OB PATIENTS CAN EXPECT   Confirmation of pregnancy and ultrasound ordered if medically indicated-[redacted] weeks gestation  New OB (NOB) intake with nurse and New OB (NOB) labs- [redacted] weeks gestation  New OB (NOB) physical examination with provider- 11/[redacted] weeks gestation  Flu vaccine-[redacted] weeks gestation  Anatomy scan-[redacted] weeks gestation  Glucose tolerance test, blood work to test for anemia, T-dap vaccine-[redacted] weeks gestation  Vaginal swabs/cultures-STD/Group B strep-[redacted] weeks gestation  Appointments every 4 weeks until 28 weeks  Every 2 weeks from 28 weeks until 36 weeks  Weekly visits from 36 weeks until delivery

## 2018-11-18 NOTE — Progress Notes (Signed)
Joyce [name] presents for NOB nurse interview visit.  Pregnancy confirmation done Phineas Real.  G-5.  P-2.  LMP 07/08/2018.  EDD 04/10/2019. Dating scan done 09/14/2018 [facility] [redacted]w[redacted]d.  Pregnancy education material explained and given.  No cats in the home.  NOB labs ordered.  TSH/HgA1C ordered due to increased BMI.There is no height or weight on file to calculate BMI.  Sickle cell ordered. HIV and drug screen were explained and ordered.  PNV encouraged.  Genetic screening options discussed.  Genetic testing: Unsure. Financial policy reviewed and FMLA form signed.

## 2018-11-18 NOTE — Progress Notes (Signed)
NEW OB HISTORY AND PHYSICAL  SUBJECTIVE:       Joyce [name] is a 28 y.o. [phone] female, Patient's last menstrual period was 07/08/2018 (approximate)., Estimated Date of Delivery: 04/10/19, [redacted]w[redacted]d, presents today for establishment of Prenatal Care.  Reports reflux with daily nausea and vomiting. History of marijuana use to increase appetite. History of cesarean section for failure to progress after induction of labor. Notes previous vaginal birth prior to c-section; however declines trial of labor.   Denies difficulty breathing or respiratory distress, chest pain, abdominal pain, vaginal bleeding, dysuria, and leg pain or swelling.   Desires genetic screening. Prefers midwifery care with MD consultation for repeat cesarean section.   Not currently taking a prenatal vitamin.    Gynecologic History  Patient's last menstrual period was 07/08/2018 (approximate).  Period Duration (Days): 4 Period Pattern: (!) Irregular Menstrual Flow: Moderate Menstrual Control: Tampon, Maxi pad Dysmenorrhea: (!) Moderate Dysmenorrhea Symptoms: Cramping  Contraception: none.  Last Pap: 2019. Results were: normal per patient.   Obstetric History  OB History  Gravida Para Term Preterm AB Living  5 2 2  0 2 2  SAB TAB Ectopic Multiple Live Births  1 0 0 0 2    # Outcome Date GA Lbr Len/2nd Weight Sex Delivery Anes PTL Lv  5 Current           4 SAB 06/09/16          3 Term 06/25/14   9 lb 8 oz (4.309 kg) M    LIV  2 Term 02/26/08   7 lb 6 oz (3.345 kg) M    LIV  1 AB 10/16/04            Past Medical History:  Diagnosis Date  . Anemia   . Hidradenitis axillaris     Past Surgical History:  Procedure Laterality Date  . INCISION AND DRAINAGE BREAST ABSCESS Left 11/2014  . OTHER SURGICAL HISTORY N/A    had c section, blood transfusion    Allergies  Allergen Reactions  . Sulfa Antibiotics Hives    Social History   Socioeconomic History  . Marital status: Single    Spouse name: Not  on file  . Number of children: Not on file  . Years of education: Not on file  . Highest education level: Not on file  Occupational History  . Not on file  Social Needs  . Financial resource strain: Not on file  . Food insecurity:    Worry: Not on file    Inability: Not on file  . Transportation needs:    Medical: Not on file    Non-medical: Not on file  Tobacco Use  . Smoking status: Never Smoker  . Smokeless tobacco: Never Used  Substance and Sexual Activity  . Alcohol use: Yes    Alcohol/week: 0.0 standard drinks    Comment: occa  . Drug use: Yes    Types: Marijuana  . Sexual activity: Not on file  Lifestyle  . Physical activity:    Days per week: Not on file    Minutes per session: Not on file  . Stress: Not on file  Relationships  . Social connections:    Talks on phone: Not on file    Gets together: Not on file    Attends religious service: Not on file    Active member of club or organization: Not on file    Attends meetings of clubs or organizations: Not on file    Relationship  status: Not on file  . Intimate partner violence:    Fear of current or ex partner: Not on file    Emotionally abused: Not on file    Physically abused: Not on file    Forced sexual activity: Not on file  Other Topics Concern  . Not on file  Social History Narrative  . Not on file    Family History  Problem Relation Age of Onset  . Migraines Mother   . Stroke Maternal Grandmother   . Cancer Maternal Grandfather 70       throat  . Cancer Paternal Grandmother     The following portions of the patient's history were reviewed and updated as appropriate: allergies, current medications, past OB history, past medical history, past surgical history, past family history, past social history, and problem list.  Review of Systems:  ROS negative except as noted above. Information obtained from patient.    OBJECTIVE:  BP 103/63   Pulse 71   Wt 177 lb 9.6 oz (80.6 kg)   LMP  07/08/2018 (Approximate)   BMI 33.56 kg/m   Initial Physical Exam (New OB)  GENERAL APPEARANCE: alert, well appearing, in no apparent distress  HEAD: normocephalic, atraumatic  MOUTH: mucous membranes moist, pharynx normal without lesions  THYROID: no thyromegaly or masses present  BREASTS: no masses noted, no significant tenderness, no palpable axillary nodes, no skin changes  LUNGS: clear to auscultation, no wheezes, rales or rhonchi, symmetric air entry  HEART: regular rate and rhythm, no murmurs  ABDOMEN: soft, nontender, nondistended, no abnormal masses, no epigastric pain, obese and FHT present  EXTREMITIES: no redness or tenderness in the calves or thighs, no edema  SKIN: normal coloration and turgor, no rashes  LYMPH NODES: no adenopathy palpable  NEUROLOGIC: alert, oriented, normal speech, no focal findings or movement disorder noted  PELVIC EXAM: deferred, no complaints  ASSESSMENT: Normal pregnancy Late entry to prenatal care Nausea and vomiting in pregnancy Reflux in pregnancy History of MJ use History of cesarean section-desires repeat  PLAN: Rx: Diclegis, Protonix, Citranatal Bloom Prenatal care including New OB labs and Panorama New OB counseling: The patient has been given an overview regarding routine prenatal care. Recommendations regarding diet, weight gain, and exercise in pregnancy were given. Prenatal testing, optional genetic testing, and ultrasound use in pregnancy were reviewed.  Second trimester education and handouts; see AVS. Benefits of Breast Feeding were discussed. The patient is encouraged to consider nursing her baby post partum. See orders   Gunnar Bulla, CNM [facility], [facility] 11/18/18 9:31 AM

## 2018-11-19 ENCOUNTER — Other Ambulatory Visit: Payer: Self-pay | Admitting: Certified Nurse Midwife

## 2018-11-19 DIAGNOSIS — Z3689 Encounter for other specified antenatal screening: Secondary | ICD-10-CM

## 2018-11-19 DIAGNOSIS — O0930 Supervision of pregnancy with insufficient antenatal care, unspecified trimester: Secondary | ICD-10-CM

## 2018-11-19 DIAGNOSIS — Z3492 Encounter for supervision of normal pregnancy, unspecified, second trimester: Secondary | ICD-10-CM

## 2018-11-19 LAB — HIV ANTIBODY (ROUTINE TESTING W REFLEX): HIV Screen 4th Generation wRfx: NONREACTIVE

## 2018-11-19 LAB — CBC WITH DIFFERENTIAL/PLATELET
Basophils Absolute: 0 10*3/uL (ref 0.0–0.2)
Basos: 0 %
EOS (ABSOLUTE): 0.4 10*3/uL (ref 0.0–0.4)
Eos: 3 %
HEMOGLOBIN: 10 g/dL — AB (ref 11.1–15.9)
Hematocrit: 30.1 % — ABNORMAL LOW (ref 34.0–46.6)
IMMATURE GRANULOCYTES: 0 %
Immature Grans (Abs): 0 10*3/uL (ref 0.0–0.1)
Lymphocytes Absolute: 2.1 10*3/uL (ref 0.7–3.1)
Lymphs: 19 %
MCH: 29.1 pg (ref 26.6–33.0)
MCHC: 33.2 g/dL (ref 31.5–35.7)
MCV: 88 fL (ref 79–97)
MONOCYTES: 5 %
Monocytes Absolute: 0.6 10*3/uL (ref 0.1–0.9)
NEUTROS PCT: 73 %
Neutrophils Absolute: 8.3 10*3/uL — ABNORMAL HIGH (ref 1.4–7.0)
Platelets: 273 10*3/uL (ref 150–450)
RBC: 3.44 x10E6/uL — AB (ref 3.77–5.28)
RDW: 13.9 % (ref 11.7–15.4)
WBC: 11.3 10*3/uL — AB (ref 3.4–10.8)

## 2018-11-19 LAB — ANTIBODY SCREEN: Antibody Screen: NEGATIVE

## 2018-11-19 LAB — HEMOGLOBIN A1C
Est. average glucose Bld gHb Est-mCnc: 100 mg/dL
Hgb A1c MFr Bld: 5.1 % (ref 4.8–5.6)

## 2018-11-19 LAB — MICROSCOPIC EXAMINATION
Casts: NONE SEEN /lpf
Epithelial Cells (non renal): >10 /hpf — AB (ref 0–10)

## 2018-11-19 LAB — URINALYSIS, ROUTINE W REFLEX MICROSCOPIC
BILIRUBIN UA: NEGATIVE
Glucose, UA: NEGATIVE
Ketones, UA: NEGATIVE
Nitrite, UA: NEGATIVE
Protein, UA: NEGATIVE
RBC, UA: NEGATIVE
Specific Gravity, UA: 1.024 (ref 1.005–1.030)
Urobilinogen, Ur: 0.2 mg/dL (ref 0.2–1.0)
pH, UA: 6 (ref 5.0–7.5)

## 2018-11-19 LAB — VARICELLA ZOSTER ANTIBODY, IGG: VARICELLA: 769 {index} (ref >165)

## 2018-11-19 LAB — RPR: RPR Ser Ql: NONREACTIVE

## 2018-11-19 LAB — SICKLE CELL SCREEN: SICKLE CELL SCREEN: NEGATIVE

## 2018-11-19 LAB — TSH: TSH: 2.23 u[IU]/mL (ref 0.450–4.500)

## 2018-11-19 LAB — HEPATITIS B SURFACE ANTIGEN: Hepatitis B Surface Ag: NEGATIVE

## 2018-11-19 LAB — RUBELLA SCREEN: Rubella Antibodies, IGG: 1.41 index (ref >0.99)

## 2018-11-19 LAB — ABO AND RH: Rh Factor: POSITIVE

## 2018-11-20 LAB — GC/CHLAMYDIA PROBE AMP
CHLAMYDIA, DNA PROBE: NEGATIVE
Neisseria gonorrhoeae by PCR: NEGATIVE

## 2018-11-21 ENCOUNTER — Encounter: Payer: Self-pay | Admitting: Maternal Newborn

## 2018-11-21 LAB — CULTURE, OB URINE

## 2018-11-21 LAB — URINE CULTURE, OB REFLEX

## 2018-11-29 ENCOUNTER — Other Ambulatory Visit: Payer: Self-pay

## 2018-11-29 ENCOUNTER — Encounter: Payer: Self-pay | Admitting: Certified Nurse Midwife

## 2018-11-29 MED ORDER — TERCONAZOLE 0.4 % VA CREA: 1.0000 | TOPICAL_CREAM | Freq: Every day | VAGINAL | 0 refills | Status: DC

## 2018-12-04 ENCOUNTER — Ambulatory Visit (INDEPENDENT_AMBULATORY_CARE_PROVIDER_SITE_OTHER): Payer: Medicaid Other | Admitting: Certified Nurse Midwife

## 2018-12-04 ENCOUNTER — Ambulatory Visit (INDEPENDENT_AMBULATORY_CARE_PROVIDER_SITE_OTHER): Payer: Medicaid Other

## 2018-12-04 VITALS — BP 101/69 | HR 80

## 2018-12-04 DIAGNOSIS — Z23 Encounter for immunization: Secondary | ICD-10-CM | POA: Diagnosis not present

## 2018-12-04 DIAGNOSIS — Z3492 Encounter for supervision of normal pregnancy, unspecified, second trimester: Secondary | ICD-10-CM

## 2018-12-04 DIAGNOSIS — Z363 Encounter for antenatal screening for malformations: Secondary | ICD-10-CM | POA: Diagnosis not present

## 2018-12-04 DIAGNOSIS — O0930 Supervision of pregnancy with insufficient antenatal care, unspecified trimester: Secondary | ICD-10-CM

## 2018-12-04 DIAGNOSIS — Z3689 Encounter for other specified antenatal screening: Secondary | ICD-10-CM

## 2018-12-04 DIAGNOSIS — Z3A22 22 weeks gestation of pregnancy: Secondary | ICD-10-CM

## 2018-12-04 LAB — POCT URINALYSIS DIPSTICK OB

## 2018-12-04 NOTE — Progress Notes (Signed)
ROB doing well. No complaints. Anatomy u/s today WNL and complete ( see below). Discussed cesarean section. Pt state she was induced for 2 days and got to 8 cm . Her baby was 10 lbs. She denies having GDM. She states she would consider labor if she went into labor and baby was not to big. Pt encouraged to eat health diet and exercise. Discussed TOLAC vs. Repeat. Discussed have MD visit for counseling. She verbalizes and agrees. Follow up 4 wks.   Doreene Burke, CNM  Patient Name: Joyce [name] DOB: [birth year] MRN: [id number]  ULTRASOUND REPORT  Location: [facility] Date of Service: 12/04/2018   Indications:Anatomy Ultrasound Findings:  Joyce [name] intrauterine pregnancy is visualized with FHR at 149 BPM. Biometrics give an (U/S) Gestational age of [redacted]w[redacted]d and an (U/S) EDD of 04/07/19; this correlates with the clinically established Estimated Date of Delivery: 04/10/19  Fetal presentation is Breech.  EFW: wnl. Placenta: posterior. Grade: 0 AFI: subjectively normal.  Anatomic survey is complete and normal; Gender - female.    Right Ovary is normal in appearance. Left Ovary is normal appearance. Survey of the adnexa demonstrates no adnexal masses. There is no free peritoneal fluid in the cul de sac.  Impression: 1. [redacted]w[redacted]d Viable Singleton Intrauterine pregnancy by U/S. 2. (U/S) EDD is consistent with Clinically established Estimated Date of Delivery: 04/10/19 . 3. Normal Anatomy Scan  Recommendations: 1.Clinical correlation with the patient's History and Physical Exam.  Augustine Radar, RDMS

## 2018-12-04 NOTE — Patient Instructions (Signed)

## 2018-12-09 ENCOUNTER — Encounter: Payer: Self-pay | Admitting: Certified Nurse Midwife

## 2018-12-10 NOTE — Telephone Encounter (Signed)
Please send normal pregnancy precautions letter. Thanks, JML

## 2018-12-17 ENCOUNTER — Encounter: Payer: Self-pay | Admitting: Certified Nurse Midwife

## 2019-01-01 ENCOUNTER — Encounter: Payer: Medicaid Other | Admitting: Obstetrics and Gynecology

## 2019-01-03 ENCOUNTER — Telehealth: Payer: Self-pay

## 2019-01-03 NOTE — Telephone Encounter (Signed)
CM made a .tcall with member today.  CM followed up with member's missed appointment on 01/01/2019.  Per member, her children were "sick at home with the flu" so she did not want to go to her appointment.  CM encouraged staying healthy and continuous hand washing.  CM encouraged member to reschedule her OB appointment as soon as possible.  Member agreeable.

## 2019-01-13 ENCOUNTER — Other Ambulatory Visit: Payer: Self-pay

## 2019-01-13 ENCOUNTER — Encounter: Payer: Self-pay | Admitting: Emergency Medicine

## 2019-01-13 ENCOUNTER — Inpatient Hospital Stay
Admission: EM | Admit: 2019-01-13 | Discharge: 2019-01-14 | Disposition: A | Discharge status: Home / Self Care | Payer: Medicaid Other | Attending: Obstetrics and Gynecology | Admitting: Obstetrics and Gynecology

## 2019-01-13 DIAGNOSIS — Z809 Family history of malignant neoplasm, unspecified: Secondary | ICD-10-CM | POA: Diagnosis not present

## 2019-01-13 DIAGNOSIS — D649 Anemia, unspecified: Secondary | ICD-10-CM | POA: Insufficient documentation

## 2019-01-13 DIAGNOSIS — O26833 Pregnancy related renal disease, third trimester: Secondary | ICD-10-CM | POA: Insufficient documentation

## 2019-01-13 DIAGNOSIS — O99613 Diseases of the digestive system complicating pregnancy, third trimester: Secondary | ICD-10-CM | POA: Diagnosis not present

## 2019-01-13 DIAGNOSIS — W19XXXA Unspecified fall, initial encounter: Secondary | ICD-10-CM

## 2019-01-13 DIAGNOSIS — K5900 Constipation, unspecified: Secondary | ICD-10-CM

## 2019-01-13 DIAGNOSIS — R55 Syncope and collapse: Secondary | ICD-10-CM | POA: Insufficient documentation

## 2019-01-13 DIAGNOSIS — Y93E1 Activity, personal bathing and showering: Secondary | ICD-10-CM | POA: Insufficient documentation

## 2019-01-13 DIAGNOSIS — Z3A28 28 weeks gestation of pregnancy: Secondary | ICD-10-CM | POA: Insufficient documentation

## 2019-01-13 DIAGNOSIS — O9989 Other specified diseases and conditions complicating pregnancy, childbirth and the puerperium: Secondary | ICD-10-CM | POA: Insufficient documentation

## 2019-01-13 DIAGNOSIS — Z800 Family history of malignant neoplasm of digestive organs: Secondary | ICD-10-CM | POA: Diagnosis not present

## 2019-01-13 DIAGNOSIS — O34219 Maternal care for unspecified type scar from previous cesarean delivery: Secondary | ICD-10-CM | POA: Diagnosis not present

## 2019-01-13 DIAGNOSIS — O99013 Anemia complicating pregnancy, third trimester: Secondary | ICD-10-CM | POA: Diagnosis not present

## 2019-01-13 DIAGNOSIS — Z882 Allergy status to sulfonamides status: Secondary | ICD-10-CM | POA: Diagnosis not present

## 2019-01-13 DIAGNOSIS — K219 Gastro-esophageal reflux disease without esophagitis: Secondary | ICD-10-CM | POA: Insufficient documentation

## 2019-01-13 DIAGNOSIS — O9A213 Injury, poisoning and certain other consequences of external causes complicating pregnancy, third trimester: Secondary | ICD-10-CM

## 2019-01-13 DIAGNOSIS — N390 Urinary tract infection, site not specified: Secondary | ICD-10-CM | POA: Insufficient documentation

## 2019-01-13 LAB — COMPREHENSIVE METABOLIC PANEL
ALT: 8 U/L (ref 0–44)
AST: 16 U/L (ref 15–41)
Albumin: 3.6 g/dL (ref 3.5–5.0)
Alkaline Phosphatase: 75 U/L (ref 38–126)
Anion gap: 11 (ref 5–15)
BUN: 7 mg/dL (ref 6–20)
CO2: 20 mmol/L — ABNORMAL LOW (ref 22–32)
Calcium: 8.8 mg/dL — ABNORMAL LOW (ref 8.9–10.3)
Chloride: 105 mmol/L (ref 98–111)
Creatinine, Ser: 0.54 mg/dL (ref 0.44–1.00)
GFR calc Af Amer: >60 mL/min (ref >60)
GFR calc non Af Amer: >60 mL/min (ref >60)
Glucose, Bld: 100 mg/dL — ABNORMAL HIGH (ref 70–99)
Potassium: 3.5 mmol/L (ref 3.5–5.1)
Sodium: 136 mmol/L (ref 135–145)
TOTAL PROTEIN: 7.6 g/dL (ref 6.5–8.1)
Total Bilirubin: 0.2 mg/dL — ABNORMAL LOW (ref 0.3–1.2)

## 2019-01-13 LAB — URINALYSIS, COMPLETE (UACMP) WITH MICROSCOPIC
Bilirubin Urine: NEGATIVE
Glucose, UA: NEGATIVE mg/dL
Hgb urine dipstick: NEGATIVE
Ketones, ur: NEGATIVE mg/dL
Nitrite: NEGATIVE
PH: 7 (ref 5.0–8.0)
Protein, ur: NEGATIVE mg/dL
Specific Gravity, Urine: 1.01 (ref 1.005–1.030)

## 2019-01-13 LAB — CBC WITH DIFFERENTIAL/PLATELET
Abs Immature Granulocytes: 0.09 10*3/uL — ABNORMAL HIGH (ref 0.00–0.07)
Basophils Absolute: 0 10*3/uL (ref 0.0–0.1)
Basophils Relative: 0 %
Eosinophils Absolute: 0 10*3/uL (ref 0.0–0.5)
Eosinophils Relative: 0 %
HCT: 30.4 % — ABNORMAL LOW (ref 36.0–46.0)
Hemoglobin: 9.5 g/dL — ABNORMAL LOW (ref 12.0–15.0)
Immature Granulocytes: 1 %
Lymphocytes Relative: 5 %
Lymphs Abs: 0.8 10*3/uL (ref 0.7–4.0)
MCH: 27.4 pg (ref 26.0–34.0)
MCHC: 31.3 g/dL (ref 30.0–36.0)
MCV: 87.6 fL (ref 80.0–100.0)
MONO ABS: 0.4 10*3/uL (ref 0.1–1.0)
Monocytes Relative: 2 %
Neutro Abs: 14.5 10*3/uL — ABNORMAL HIGH (ref 1.7–7.7)
Neutrophils Relative %: 92 %
Platelets: 275 10*3/uL (ref 150–400)
RBC: 3.47 MIL/uL — ABNORMAL LOW (ref 3.87–5.11)
RDW: 14.8 % (ref 11.5–15.5)
WBC: 15.8 10*3/uL — ABNORMAL HIGH (ref 4.0–10.5)
nRBC: 0 % (ref 0.0–0.2)

## 2019-01-13 MED ORDER — ACETAMINOPHEN 325 MG PO TABS
650.0000 mg | ORAL_TABLET | Freq: Four times a day (QID) | ORAL | Status: DC | PRN
  Administered 2019-01-13: 650 mg via ORAL
  Filled 2019-01-13: qty 2

## 2019-01-13 MED ORDER — FLEET ENEMA 7-19 GM/118ML RE ENEM
1.0000 | ENEMA | Freq: Once | RECTAL | Status: AC
  Administered 2019-01-13: 1 via RECTAL

## 2019-01-13 MED ORDER — PROMETHAZINE HCL 25 MG PO TABS: 25.0000 mg | ORAL_TABLET | Freq: Four times a day (QID) | ORAL | Status: DC | PRN

## 2019-01-13 MED ORDER — BISACODYL 10 MG RE SUPP
10.0000 mg | Freq: Once | RECTAL | Status: AC
  Administered 2019-01-14: 10 mg via RECTAL
  Filled 2019-01-13: qty 1

## 2019-01-13 MED ORDER — DOCUSATE SODIUM 100 MG PO CAPS
100.0000 mg | ORAL_CAPSULE | Freq: Two times a day (BID) | ORAL | Status: DC | PRN
  Administered 2019-01-13: 100 mg via ORAL
  Filled 2019-01-13 (×2): qty 1

## 2019-01-13 MED ORDER — FLUCONAZOLE 50 MG PO TABS
150.0000 mg | ORAL_TABLET | Freq: Once | ORAL | Status: AC
  Administered 2019-01-13: 150 mg via ORAL
  Filled 2019-01-13: qty 1

## 2019-01-13 MED ORDER — NITROFURANTOIN MONOHYD MACRO 100 MG PO CAPS: 100.0000 mg | ORAL_CAPSULE | Freq: Two times a day (BID) | ORAL | 0 refills | Status: AC

## 2019-01-13 MED ORDER — WITCH HAZEL-GLYCERIN EX PADS
MEDICATED_PAD | CUTANEOUS | Status: DC | PRN
  Administered 2019-01-14: 02:00:00 via TOPICAL
  Filled 2019-01-13: qty 100

## 2019-01-13 MED ORDER — DEXTROSE IN LACTATED RINGERS 5 % IV SOLN
INTRAVENOUS | Status: DC
  Administered 2019-01-13: 500 mL via INTRAVENOUS

## 2019-01-13 NOTE — OB Triage Note (Addendum)
Pt is a G5P2 at 28 wk 0d seen in triage for a fall. Pt states "she was feeling faint while in the shower and fell backwards around 1600." Pt denies hitting her belly during the fall. Pt rates her pain a 6/10. Pt also has a c/o of constipation and difficulty urinating. Pt states she has not had a bowel movement in a week and a half and states she has not been able to urinate in two days. Pt denies vaginal bleeding and LOF. Pt states positive fetal movement. FHT 124. Monitors applied and assessing.

## 2019-01-13 NOTE — OB Triage Provider Note (Signed)
Joyce [name] is a 28 y.o. [id number] at [redacted]w[redacted]d who is admitted for observation after falling in the shower onto her backside today at 1600.  Estimated Date of Delivery: 04/07/19 Fetal presentation is unsure.  Length of Stay:  0 Days. Admitted 01/13/2019  Subjective: Patient states she was in the shower, got lightheaded and fell on her backside, states her tailbone hurts. These light headed episodes are not new for her. She denies hitting her abdomen. She also complained of not having a BM in 10 days but had one while her for observation, and she states she has not been able to void well for 2 and half days, but again voided her while up to have a BM.  Patient reports good fetal movement.  She reports no  uterine contractions, no bleeding and no loss of fluid per vagina.  Vitals:  Blood pressure (!) 115/56, pulse 82, temperature 98.1 F (36.7 C), temperature source Oral, resp. rate 20, height 5\' 1"  (1.549 m), weight 81.6 kg, last menstrual period 07/08/2018, SpO2 100 %. Physical Examination: CONSTITUTIONAL: Well-developed, well-nourished female in no acute distress.  SKIN: Skin is warm and dry. No rash noted. Not diaphoretic. No erythema. No pallor. NEUROLGIC: Alert and oriented to person, place, and time. Normal reflexes, muscle tone coordination. No cranial nerve deficit noted. PSYCHIATRIC: Normal mood and affect. Normal behavior. Normal judgment and thought content. CARDIOVASCULAR: Normal heart rate noted, regular rhythm RESPIRATORY: Effort and breath sounds normal, no problems with respiration noted MUSCULOSKELETAL: Normal range of motion. No edema and no tenderness. 2+ distal pulses. ABDOMEN: Soft, nontender, nondistended, gravid. CERVIX:    Fetal monitoring: FHR: 148bpm, Variability: moderate, Accelerations: Present, Decelerations: variable  Uterine activity: none contractions  Results for orders placed or performed during the hospital encounter of 01/13/19 (from the past 48 hour(s))   CBC with Differential     Status: Abnormal   Collection Time: 01/13/19  5:57 PM  Result Value Ref Range   WBC 15.8 (H) 4.0 - 10.5 K/uL   RBC 3.47 (L) 3.87 - 5.11 MIL/uL   Hemoglobin 9.5 (L) 12.0 - 15.0 g/dL   HCT 87.6 (L) 81.1 - 57.2 %   MCV 87.6 80.0 - 100.0 fL   MCH 27.4 26.0 - 34.0 pg   MCHC 31.3 30.0 - 36.0 g/dL   RDW 62.0 35.5 - 97.4 %   Platelets 275 150 - 400 K/uL   nRBC 0.0 0.0 - 0.2 %   Neutrophils Relative % 92 %   Neutro Abs 14.5 (H) 1.7 - 7.7 K/uL   Lymphocytes Relative 5 %   Lymphs Abs 0.8 0.7 - 4.0 K/uL   Monocytes Relative 2 %   Monocytes Absolute 0.4 0.1 - 1.0 K/uL   Eosinophils Relative 0 %   Eosinophils Absolute 0.0 0.0 - 0.5 K/uL   Basophils Relative 0 %   Basophils Absolute 0.0 0.0 - 0.1 K/uL   Immature Granulocytes 1 %   Abs Immature Granulocytes 0.09 (H) 0.00 - 0.07 K/uL    Comment: Performed at [facility], [address] [place], [place] [address]  Comprehensive metabolic panel     Status: Abnormal   Collection Time: 01/13/19  5:57 PM  Result Value Ref Range   Sodium 136 135 - 145 mmol/L   Potassium 3.5 3.5 - 5.1 mmol/L   Chloride 105 98 - 111 mmol/L   CO2 20 (L) 22 - 32 mmol/L   Glucose, Bld 100 (H) 70 - 99 mg/dL   BUN 7 6 -  20 mg/dL   Creatinine, Ser 5.44 0.44 - 1.00 mg/dL   Calcium 8.8 (L) 8.9 - 10.3 mg/dL   Total Protein 7.6 6.5 - 8.1 g/dL   Albumin 3.6 3.5 - 5.0 g/dL   AST 16 15 - 41 U/L   ALT 8 0 - 44 U/L   Alkaline Phosphatase 75 38 - 126 U/L   Total Bilirubin 0.2 (L) 0.3 - 1.2 mg/dL   GFR calc non Af Amer >60 >60 mL/min   GFR calc Af Amer >60 >60 mL/min   Anion gap 11 5 - 15    Comment: Performed at [facility], [address] [place], [place] [address]  Urinalysis, Complete w Microscopic     Status: Abnormal   Collection Time: 01/13/19  6:33 PM  Result Value Ref Range   Color, Urine YELLOW (A) YELLOW   APPearance CLOUDY (A) CLEAR   Specific Gravity, Urine 1.010 1.005 - 1.030   pH 7.0 5.0 - 8.0   Glucose,  UA NEGATIVE NEGATIVE mg/dL   Hgb urine dipstick NEGATIVE NEGATIVE   Bilirubin Urine NEGATIVE NEGATIVE   Ketones, ur NEGATIVE NEGATIVE mg/dL   Protein, ur NEGATIVE NEGATIVE mg/dL   Nitrite NEGATIVE NEGATIVE   Leukocytes,Ua SMALL (A) NEGATIVE   RBC / HPF 0-5 0 - 5 RBC/hpf   WBC, UA 11-20 0 - 5 WBC/hpf   Bacteria, UA MANY (A) NONE SEEN   Squamous Epithelial / LPF 21-50 0 - 5   Mucus PRESENT     Comment: Performed at [facility], [address] [place], [place] [address]    No results found.  Current scheduled medications . fluconazole  150 mg Oral Once    I have reviewed the patient's current medications.I also noted in last urine culture that she has an untreated UTI with lactobaccili.  ASSESSMENT: Patient Active Problem List   Diagnosis Date Noted  . Late prenatal care 11/18/2018  . Nausea/vomiting in pregnancy 11/18/2018  . Gastroesophageal reflux in pregnancy 11/18/2018  . Hidradenitis axillaris 08/31/2015  . Anemia   . Hidradenitis 12/26/2013  fall at 28 weeks UTI constipation  PLAN: Diflucan 150mg  po given Colace 100mg  given Po hydration while monitoring.  Tylenol as needed.  Reassured of normal findings.    Masao Junker Suzan Nailer, CNM [facility]

## 2019-01-13 NOTE — ED Provider Notes (Signed)
[facility] Emergency Department Provider Note  ____________________________________________   I have reviewed the triage vital signs and the nursing notes.   HISTORY  Chief Complaint Near Syncope   History limited by: Not Limited   HPI Joyce [name] is a 28 y.o. female who presents to the emergency department today after a near syncopal episode. The patient states that she had two such episodes today. Felt like she was seeing spots and got weak. Denies ever completely blacking out. She did fall and is now having pain in her tailbone. The patient does state that she has also been feeling the sensation of pins and needles in her legs for a few days. The patient additionally is complaining of constipation and decreased urination. She states she has not had a bowel movement in 1 week and has not had a drop of urine for 2 days. Has had issues with constipation during pregnancy in the past but never this bad.    Records reviewed. Per medical record review patient has a history of anemia.   Past Medical History:  Diagnosis Date  . Anemia   . Hidradenitis axillaris     Patient Active Problem List   Diagnosis Date Noted  . Late prenatal care 11/18/2018  . Nausea/vomiting in pregnancy 11/18/2018  . Gastroesophageal reflux in pregnancy 11/18/2018  . Hidradenitis axillaris 08/31/2015  . Anemia   . Hidradenitis 12/26/2013    Past Surgical History:  Procedure Laterality Date  . INCISION AND DRAINAGE BREAST ABSCESS Left 11/2014  . OTHER SURGICAL HISTORY N/A    had c section, blood transfusion    Prior to Admission medications   Medication Sig Start Date End Date Taking? Authorizing Provider  Doxylamine-Pyridoxine (DICLEGIS) 10-10 MG TBEC Take 2 tablets by mouth at bedtime. If symptoms persist, add one tablet in the morning and one in the afternoon Patient not taking: Reported on 12/04/2018 11/18/18   Gunnar Bulla, CNM  pantoprazole (PROTONIX) 20  MG tablet Take 1 tablet (20 mg total) by mouth daily. 11/18/18   Lawhorn, Vanessa [place], CNM  Prenatal-DSS-FeCb-FeGl-FA (CITRANATAL BLOOM) 90-1 MG TABS Take 1 tablet by mouth daily. 11/18/18   Gunnar Bulla, CNM  terconazole (TERAZOL 7) 0.4 % vaginal cream Place 1 applicator vaginally at bedtime. 11/29/18   Gunnar Bulla, CNM    Allergies Sulfa antibiotics  Family History  Problem Relation Age of Onset  . Migraines Mother   . Stroke Maternal Grandmother   . Cancer Maternal Grandfather 70       throat  . Cancer Paternal Grandmother     Social History Social History   Tobacco Use  . Smoking status: Never Smoker  . Smokeless tobacco: Never Used  Substance Use Topics  . Alcohol use: Yes    Alcohol/week: 0.0 standard drinks    Comment: occa  . Drug use: Yes    Types: Marijuana    Review of Systems Constitutional: No fever/chills Eyes: Seeing spots.  ENT: No sore throat. Cardiovascular: Denies chest pain. Respiratory: Denies shortness of breath. Gastrointestinal: No abdominal pain. Positive for constipation.  Genitourinary: Positive for decreased urination.  Musculoskeletal: Positive for pain in the tailbone.  Skin: Negative for rash. Neurological: Negative for headaches, focal weakness or numbness.  ____________________________________________   PHYSICAL EXAM:  VITAL SIGNS: ED Triage Vitals  Enc Vitals Group     BP 01/13/19 1707 118/61     Pulse Rate 01/13/19 1707 86     Resp --  Temp 01/13/19 1707 97.7 F (36.5 C)     Temp Source 01/13/19 1707 Oral     SpO2 01/13/19 1707 100 %     Weight 01/13/19 1708 180 lb (81.6 kg)     Height 01/13/19 1708 5\' 1"  (1.549 m)     Head Circumference --      Peak Flow --      Pain Score 01/13/19 1707 8   Constitutional: Alert and oriented.  Eyes: Conjunctivae are normal.  ENT      Head: Normocephalic and atraumatic.      Nose: No congestion/rhinnorhea.      Mouth/Throat: Mucous membranes are  moist.      Neck: No stridor. Hematological/Lymphatic/Immunilogical: No cervical lymphadenopathy. Cardiovascular: Normal rate, regular rhythm.  No murmurs, rubs, or gallops. Respiratory: Normal respiratory effort without tachypnea nor retractions. Breath sounds are clear and equal bilaterally. No wheezes/rales/rhonchi. Gastrointestinal: Soft and non tender. No rebound. No guarding.  Genitourinary: Deferred Musculoskeletal: Normal range of motion in all extremities. No lower extremity edema. Neurologic:  Normal speech and language. No gross focal neurologic deficits are appreciated.  Skin:  Skin is warm, dry and intact. No rash noted. Psychiatric: Mood and affect are normal. Speech and behavior are normal. Patient exhibits appropriate insight and judgment.  ____________________________________________    LABS (pertinent positives/negatives)  CMP na 136, k 3.5, glu 100, cr 0.54 UA cloudy, small leukocytes, 11-20 WBC, many bacteria, 21-50 squamous epitheleal CBC wbc 15.8, hgb 9.5, plt 275  ____________________________________________   EKG  I, Phineas Semen, attending physician, personally viewed and interpreted this EKG  EKG Time: 1718 Rate: 87 Rhythm: sinus rhythm Axis: normal Intervals: qtc 425 QRS: narrow ST changes: no st elevation Impression: normal ekg  ____________________________________________    RADIOLOGY  None  ____________________________________________   PROCEDURES  Procedures  ____________________________________________   INITIAL IMPRESSION / ASSESSMENT AND PLAN / ED COURSE  Pertinent labs & imaging results that were available during my care of the patient were reviewed by me and considered in my medical decision making (see chart for details).  Patient presented to the emergency department today with primary complaint of near syncopal episode.  Patient is roughly [redacted] weeks pregnant.  After the fall she is complaining only of some tailbone  pain.  She denies any chest pain when she felt weak and fell.  Differential would be broad including anemia, electrolyte abnormality, infection among other etiologies. Patient's work up was concerning for urinary tract infection. Lungs were clear to auscultation. Doubt PE. Will send patient to [facility] for fetal monitoring.    ____________________________________________   FINAL CLINICAL IMPRESSION(S) / ED DIAGNOSES  Final diagnoses:  Lower urinary tract infectious disease  Fall, initial encounter     Note: This dictation was prepared with Office manager. Any transcriptional errors that result from this process are unintentional     Phineas Semen, MD 01/13/19 2220

## 2019-01-13 NOTE — Discharge Instructions (Addendum)
Constipation, Adult Constipation is when a person:  Poops (has a bowel movement) fewer times in a week than normal.  Has a hard time pooping.  Has poop that is dry, hard, or bigger than normal. Follow these instructions at home: Eating and drinking   Eat foods that have a lot of fiber, such as: ? Fresh fruits and vegetables. ? Whole grains. ? Beans.  Eat less of foods that are high in fat, low in fiber, or overly processed, such as: ? [place] fries. ? Hamburgers. ? Cookies. ? Candy. ? Soda.  Drink enough fluid to keep your pee (urine) clear or pale yellow. General instructions  Exercise regularly or as told by your doctor.  Go to the restroom when you feel like you need to poop. Do not hold it in.  Take over-the-counter and prescription medicines only as told by your doctor. These include any fiber supplements.  Do pelvic floor retraining exercises, such as: ? Doing deep breathing while relaxing your lower belly (abdomen). ? Relaxing your pelvic floor while pooping.  Watch your condition for any changes.  Keep all follow-up visits as told by your doctor. This is important. Contact a doctor if:  You have pain that gets worse.  You have a fever.  You have not pooped for 4 days.  You throw up (vomit).  You are not hungry.  You lose weight.  You are bleeding from the anus.  You have thin, pencil-like poop (stool). Get help right away if:  You have a fever, and your symptoms suddenly get worse.  You leak poop or have blood in your poop.  Your belly feels hard or bigger than normal (is bloated).  You have very bad belly pain.  You feel dizzy or you faint. This information is not intended to replace advice given to you by your health care provider. Make sure you discuss any questions you have with your health care provider. Document Released: 03/20/2008 Document Revised: 04/21/2016 Document Reviewed: 03/22/2016 Elsevier Interactive Patient Education   2019 [employer]. Please seek medical attention for any high fevers, chest pain, shortness of breath, change in behavior, persistent vomiting, bloody stool or any other new or concerning symptoms.

## 2019-01-13 NOTE — ED Triage Notes (Signed)
Pt ems from home for near syncope. Pt was in shower felt lightheaded and fell on bottom. Pt with tailbone pain. Pt [redacted] weeks pregnant, g3, p2. Pt also c/o being unable to urinate x 2 days and constipation.

## 2019-02-13 ENCOUNTER — Telehealth: Payer: Self-pay | Admitting: Obstetrics and Gynecology

## 2019-02-13 NOTE — Telephone Encounter (Signed)
Angie from [facility] called to verify patients contact # °

## 2019-03-06 ENCOUNTER — Telehealth: Payer: Self-pay

## 2019-03-06 NOTE — Telephone Encounter (Signed)
Coronavirus (COVID-19) Are you at risk?  Are you at risk for the Coronavirus (COVID-19)?  To be considered HIGH RISK for Coronavirus (COVID-19), you have to meet the following criteria:  . Traveled to [place], [place], [place], [place] or [place]; or in the [place] to [place], [place], [place], or [place]; and have fever, cough, and shortness of breath within the last 2 weeks of travel OR . Been in close contact with a person diagnosed with COVID-19 within the last 2 weeks and have fever, cough, and shortness of breath . IF YOU DO NOT MEET THESE CRITERIA, YOU ARE CONSIDERED LOW RISK FOR COVID-19.  What to do if you are HIGH RISK for COVID-19?  . If you are having a medical emergency, call 911. . Seek medical care right away. Before you go to a doctor's office, urgent care or emergency department, call ahead and tell them about your recent travel, contact with someone diagnosed with COVID-19, and your symptoms. You should receive instructions from your physician's office regarding next steps of care.  . When you arrive at healthcare provider, tell the healthcare staff immediately you have returned from visiting [place], [place], [place], [place] or [place]; or traveled in the [place] to [place], [place], [place], or [place]; in the last two weeks or you have been in close contact with a person diagnosed with COVID-19 in the last 2 weeks.   . Tell the health care staff about your symptoms: fever, cough and shortness of breath. . After you have been seen by a medical provider, you will be either: o Tested for (COVID-19) and discharged home on quarantine except to seek medical care if symptoms worsen, and asked to  - Stay home and avoid contact with others until you get your results (4-5 days)  - Avoid travel on public transportation if possible (such as bus, train, or airplane) or o Sent to the Emergency Department by EMS for evaluation, COVID-19 testing, and possible  admission depending on your condition and test results.  What to do if you are LOW RISK for COVID-19?  Reduce your risk of any infection by using the same precautions used for avoiding the common cold or flu:  . Wash your hands often with soap and warm water for at least 20 seconds.  If soap and water are not readily available, use an alcohol-based hand sanitizer with at least 60% alcohol.  . If coughing or sneezing, cover your mouth and nose by coughing or sneezing into the elbow areas of your shirt or coat, into a tissue or into your sleeve (not your hands). . Avoid shaking hands with others and consider head nods or verbal greetings only. . Avoid touching your eyes, nose, or mouth with unwashed hands.  . Avoid close contact with people who are sick. . Avoid places or events with large numbers of people in one location, like concerts or sporting events. . Carefully consider travel plans you have or are making. . If you are planning any travel outside or inside the [place], visit the CDC's Travelers' Health webpage for the latest health notices. . If you have some symptoms but not all symptoms, continue to monitor at home and seek medical attention if your symptoms worsen. . If you are having a medical emergency, call 911.   ADDITIONAL HEALTHCARE OPTIONS FOR PATIENTS  [facility] / e-Visit: [email or web address]         [facility]: [phone]  [facility]: [phone]                   [facility]: [phone]   Pre-screen negative, DM.   

## 2019-03-07 ENCOUNTER — Other Ambulatory Visit: Payer: Self-pay

## 2019-03-07 ENCOUNTER — Ambulatory Visit (INDEPENDENT_AMBULATORY_CARE_PROVIDER_SITE_OTHER): Payer: Medicaid Other | Admitting: Obstetrics and Gynecology

## 2019-03-07 VITALS — BP 108/53 | HR 84 | Wt 191.1 lb

## 2019-03-07 DIAGNOSIS — Z3493 Encounter for supervision of normal pregnancy, unspecified, third trimester: Secondary | ICD-10-CM

## 2019-03-07 DIAGNOSIS — D509 Iron deficiency anemia, unspecified: Secondary | ICD-10-CM

## 2019-03-07 DIAGNOSIS — Z113 Encounter for screening for infections with a predominantly sexual mode of transmission: Secondary | ICD-10-CM

## 2019-03-07 DIAGNOSIS — Z3685 Encounter for antenatal screening for Streptococcus B: Secondary | ICD-10-CM

## 2019-03-07 LAB — POCT URINALYSIS DIPSTICK OB
Bilirubin, UA: NEGATIVE
Blood, UA: NEGATIVE
Glucose, UA: NEGATIVE
Ketones, UA: NEGATIVE
Leukocytes, UA: NEGATIVE
Nitrite, UA: NEGATIVE
Spec Grav, UA: 1.015 (ref 1.010–1.025)
Urobilinogen, UA: 0.2 E.U./dL
pH, UA: 7.5 (ref 5.0–8.0)

## 2019-03-07 NOTE — Patient Instructions (Addendum)
FREQUENTLY ASKED QUESTIONS FOR OBSTETRICS/PEDIATRICS    Q: Why are visitor restrictions different for maternity care areas?  [place] is restricting visitors for the duration of the patient's hospitalization. The birth of a child involves the mother, considered the patient, and a birthing partner. These are unprecedented times and we are making the exception to allow a birthing partner to be a part of the patient unit. No other guests will be allowed in our [place] at [facility] and at [facility].   Q: Are credentialed doulas allowed to support their existing patients?  We acknowledge the value these doula partnerships offer our care teams and many birthing families in our communities. Each laboring mother is allowed one birthing partner of the patient's choosing for her entire hospitalization.   Q: Are visitor restrictions different for hospitalized children?  Pediatric patients (infants and children under 1 years of age), such as those in the Children's Unit, Pediatric ICU and NICU, will be allowed two visitors (parents or legal guardians)   Q: Are pregnant women at an increased risk for COVID-19?  The [employer] of Obstetricians and Gynecologists (ACOG) is monitoring closely the coronavirus pandemic. With the limited information available, data does not indicate pregnant women are at an increased risk. However, pregnant women are known to be at greater risk for respiratory infections like flu. With that in mind, expectant mothers are considered an at-risk population for COVID-19, according to ACOG.   Q: Are newborns at an increased risk for COVID-19?  A limited sample of COVID-19 data with newborns indicates the virus is not transferred to the infant during pregnancy. However, postpartum separation is recommended by the Centers for  Disease Control (CDC). As a result [place] recommends and strongly encourages temporary separation of moms and babies who test positive for COVID-19 or are awaiting results to rule out COVID-19 based on CDC guidelines.   Q: If you have a suspected case of COVID-19, is the NICU couplet care room an option?  No. If either patient is considered at-risk for having COVID-19, the [place] [facility] will not use the NICU couplet care rooms for that family.   Q: [place] is urging that elective procedures be postponed. What is considered elective for women's and children's service line?  NOT ELECTIVE: Obstetric procedures, even those with an element of choice on timing, are not considered elective. Circumcisions are considered elective procedures, however, these do not deplete blood products and other resources, which is the spirit in which the COVID-19 postponement of elective procedures was intended. Therefore, circumcisions will be allowed.   ELECTIVE: Postpartum tubal ligations are considered elective and should be postponed. Q&A for Obstetricians, Gynecologists and Pediatricians  Published January 03, 2019   [facility] supports as much as possible the medical care  team working with the patient's individual needs to address timing during these unprecedented times. We seek the support of our medical care team in preserving needed resources throughout our crisis response to COVID-19.   Q: How does COVID-19 impact breastfeeding?  Breastmilk is safe for your baby - even if the mother has tested positive for COVID-19. If a COVID-19+ mother decides to breastfeed while inpatient and after discharge, we suggest proper protective equipment be worn and hand hygiene be performed before and after feeding the infant. The new mother also has the option to pump her milk and have a healthy family member feed the baby to protect the baby from getting the virus.   Q: Should we urge  patients to avoid baby showers and large gatherings?  Yes. As has been recommended for all citizens in our communities, gatherings of 10 or more should be avoided - pregnant or not. Seek creative options for "hosting" baby showers through electronic means that honor the request for social distancing during this time of heightened awareness.   Q: Should patients miss their prenatal appointments?  No. Prenatal visits are NOT elective. While we want to limit contact and exposure, prenatal care is vital right now. Contact your physician's office if you have concerns about your visits. We are limiting outpatient office visits to the patient and one guest in order to reduce the potential for exposure.   Q: What if a pregnant woman feels sick? Should she miss her prenatal visit then?  A pregnant woman experiencing coronavirus-like symptoms (i.e., cough, fever, difficulty breathing, shortness of breath, gastrointestinal issues) should contact her pregnancy care provider by phone. Her medical professional can best determine whether she should use a video visit or possibly go to a collection site to be tested for COVID-19. Contacting her primary care provider or her pregnancy care provider is her first step.   Q: What can I do about childbirth education? All the classes are cancelled.  The [facility] [place] will offer online learning to support mothers on their journey. We currently offer Understanding Childbirth, Understanding Breastfeeding and Understanding Newborn Care as an online class. Please visit our website, [email or web address], to register for an online class.   Q: How can I keep from getting COVID-19? Q&A for Obstetricians, Gynecologists and Pediatricians  Published January 03, 2019   Together, we can reduce the risk of exposure to the virus and help you and your family remain healthy and safe. One of the best ways to protect yourself is to wash your hands frequently using soap and  water. Also, you should avoid touching your eyes, nose and mouth with unwashed hands, avoid physical contact with others and practice social distancing.   Q: How are employees being informed about what to do?  [place] leaders receive a daily COVID-19 update and share relevant information with their teams. This is a time when health care professionals are called on to lead within our community. We appreciate our staff's engagement with our COVID-19 updates and encourage them to share best practices on reducing the spread of the virus with our patients and community. We are prepared to provide the exceptional COVID-19 care and coordination our community needs, expects and deserves.   Q: Who's in charge of this issue at [facility]?  The leadership structure and process established to address COVID-19 includes Chief Physician Executive Phoebe Sharps, MD; Infection Prevention Medical Director Carlyle Basques, MD; and Infection Prevention Interim Director Hubert Azure, MSN, RN, CIC, CSPDT. A team  of [place] experts reflecting a broad spectrum of our workforce is meeting daily to evaluate new information we receive about COVID-19 and to adapt policies and practices accordingly.                         Published January 03, 2019     Iron-Rich Diet  Iron is a mineral that helps your body to produce hemoglobin. Hemoglobin is a protein in red blood cells that carries oxygen to your body's tissues. Eating too little iron may cause you to feel weak and tired, and it can increase your risk of infection. Iron is naturally found in many foods, and many foods have iron added to them (iron-fortified foods). You may need to follow an iron-rich diet if you do not have enough iron in your body due to certain medical conditions. The amount of iron that you need each day depends on your age, your sex, and any medical conditions you have. Follow instructions from your health care provider or a diet and nutrition  specialist (dietitian) about how much iron you should eat each day. What are tips for following this plan? Reading food labels  Check food labels to see how many milligrams (mg) of iron are in each serving. Cooking  Cook foods in pots and pans that are made from iron.  Take these steps to make it easier for your body to absorb iron from certain foods: ? Soak beans overnight before cooking. ? Soak whole grains overnight and drain them before using. ? Ferment flours before baking, such as by using yeast in bread dough. Meal planning  When you eat foods that contain iron, you should eat them with foods that are high in vitamin C. These include oranges, peppers, tomatoes, potatoes, and mango. Vitamin C helps your body to absorb iron. General information  Take iron supplements only as told by your health care provider. An overdose of iron can be life-threatening. If you were prescribed iron supplements, take them with orange juice or a vitamin C supplement.  When you eat iron-fortified foods or take an iron supplement, you should also eat foods that naturally contain iron, such as meat, poultry, and fish. Eating naturally iron-rich foods helps your body to absorb the iron that is added to other foods or contained in a supplement.  Certain foods and drinks prevent your body from absorbing iron properly. Avoid eating these foods in the same meal as iron-rich foods or with iron supplements. These foods include: ? Coffee, black tea, and red wine. ? Milk, dairy products, and foods that are high in calcium. ? Beans and soybeans. ? Whole grains. What foods should I eat? Fruits Prunes. Raisins. Eat fruits high in vitamin C, such as oranges, grapefruits, and strawberries, alongside iron-rich foods. Vegetables Spinach (cooked). Green peas. Broccoli. Fermented vegetables. Eat vegetables high in vitamin C, such as leafy greens, potatoes, bell peppers, and tomatoes, alongside iron-rich foods. Grains  Iron-fortified breakfast cereal. Iron-fortified whole-wheat bread. Enriched rice. Sprouted grains. Meats and other proteins Beef liver. Oysters. Beef. Shrimp. [place]. Chicken. Tuna. Sardines. Chickpeas. Nuts. Tofu. Pumpkin seeds. Beverages Tomato juice. Fresh orange juice. Prune juice. Hibiscus tea. Fortified instant breakfast shakes. Sweets and desserts Blackstrap molasses. Seasonings and condiments Tahini. Fermented soy sauce. Other foods Wheat germ. The items listed above may not be a complete list of recommended foods and beverages. Contact a dietitian for more information. What foods should I avoid? Grains Whole grains. Bran cereal. Bran flour.  Oats. Meats and other proteins Soybeans. Products made from soy protein. Black beans. Lentils. Mung beans. Split peas. Dairy Milk. Cream. Cheese. Yogurt. Cottage cheese. Beverages Coffee. Black tea. Red wine. Sweets and desserts Cocoa. Chocolate. Ice cream. Other foods Basil. Oregano. Large amounts of parsley. The items listed above may not be a complete list of foods and beverages to avoid. Contact a dietitian for more information. Summary  Iron is a mineral that helps your body to produce hemoglobin. Hemoglobin is a protein in red blood cells that carries oxygen to your body's tissues.  Iron is naturally found in many foods, and many foods have iron added to them (iron-fortified foods).  When you eat foods that contain iron, you should eat them with foods that are high in vitamin C. Vitamin C helps your body to absorb iron.  Certain foods and drinks prevent your body from absorbing iron properly, such as whole grains and dairy products. You should avoid eating these foods in the same meal as iron-rich foods or with iron supplements. This information is not intended to replace advice given to you by your health care provider. Make sure you discuss any questions you have with your health care provider. Document Released: 05/16/2005  Document Revised: 08/28/2017 Document Reviewed: 08/28/2017 Elsevier Interactive Patient Education  2019 [employer].

## 2019-03-07 NOTE — Progress Notes (Signed)
ROB- cultures obtained, pt is having pelvic pressure 

## 2019-03-07 NOTE — Progress Notes (Signed)
ROB- anemia labs repeated. Will see MD next week to discuss TOLAC or repeat c/s.  Leaning towards repeat c/s, mother plans to come and help her afterwards, needs PNV and iron. Lives alone with other 2 children.

## 2019-03-08 LAB — CBC
Hematocrit: 26.9 % — ABNORMAL LOW (ref 34.0–46.6)
Hemoglobin: 8.7 g/dL — ABNORMAL LOW (ref 11.1–15.9)
MCH: 26.9 pg (ref 26.6–33.0)
MCHC: 32.3 g/dL (ref 31.5–35.7)
MCV: 83 fL (ref 79–97)
Platelets: 239 10*3/uL (ref 150–450)
RBC: 3.23 x10E6/uL — ABNORMAL LOW (ref 3.77–5.28)
RDW: 15.5 % — ABNORMAL HIGH (ref 11.7–15.4)
WBC: 10.7 10*3/uL (ref 3.4–10.8)

## 2019-03-08 LAB — VITAMIN D 25 HYDROXY (VIT D DEFICIENCY, FRACTURES): Vit D, 25-Hydroxy: 12.2 ng/mL — ABNORMAL LOW (ref 30.0–100.0)

## 2019-03-08 LAB — FERRITIN: Ferritin: 6 ng/mL — ABNORMAL LOW (ref 15–150)

## 2019-03-09 LAB — STREP GP B NAA: Strep Gp B NAA: NEGATIVE

## 2019-03-11 NOTE — Progress Notes (Signed)
Yes, she should be seen by Hematology.  This is pretty low.

## 2019-03-12 ENCOUNTER — Other Ambulatory Visit: Payer: Self-pay | Admitting: Obstetrics and Gynecology

## 2019-03-12 DIAGNOSIS — Z3493 Encounter for supervision of normal pregnancy, unspecified, third trimester: Secondary | ICD-10-CM

## 2019-03-12 DIAGNOSIS — D509 Iron deficiency anemia, unspecified: Secondary | ICD-10-CM

## 2019-03-12 LAB — GC/CHLAMYDIA PROBE AMP
Chlamydia trachomatis, NAA: NEGATIVE
Neisseria Gonorrhoeae by PCR: NEGATIVE

## 2019-03-14 ENCOUNTER — Ambulatory Visit (INDEPENDENT_AMBULATORY_CARE_PROVIDER_SITE_OTHER): Payer: Medicaid Other | Admitting: Obstetrics and Gynecology

## 2019-03-14 ENCOUNTER — Other Ambulatory Visit: Payer: Self-pay

## 2019-03-14 ENCOUNTER — Encounter: Payer: Self-pay | Admitting: Obstetrics and Gynecology

## 2019-03-14 VITALS — BP 114/79 | HR 102 | Wt 188.9 lb

## 2019-03-14 DIAGNOSIS — O99013 Anemia complicating pregnancy, third trimester: Secondary | ICD-10-CM

## 2019-03-14 DIAGNOSIS — O26899 Other specified pregnancy related conditions, unspecified trimester: Secondary | ICD-10-CM

## 2019-03-14 DIAGNOSIS — Z9289 Personal history of other medical treatment: Secondary | ICD-10-CM | POA: Insufficient documentation

## 2019-03-14 DIAGNOSIS — O09293 Supervision of pregnancy with other poor reproductive or obstetric history, third trimester: Secondary | ICD-10-CM

## 2019-03-14 DIAGNOSIS — O34219 Maternal care for unspecified type scar from previous cesarean delivery: Secondary | ICD-10-CM | POA: Insufficient documentation

## 2019-03-14 DIAGNOSIS — R102 Pelvic and perineal pain: Secondary | ICD-10-CM

## 2019-03-14 DIAGNOSIS — Z3483 Encounter for supervision of other normal pregnancy, third trimester: Secondary | ICD-10-CM

## 2019-03-14 DIAGNOSIS — O26893 Other specified pregnancy related conditions, third trimester: Secondary | ICD-10-CM

## 2019-03-14 LAB — POCT URINALYSIS DIPSTICK OB
Bilirubin, UA: NEGATIVE
Blood, UA: NEGATIVE
Glucose, UA: NEGATIVE
Leukocytes, UA: NEGATIVE
Nitrite, UA: NEGATIVE
Spec Grav, UA: 1.025 (ref 1.010–1.025)
Urobilinogen, UA: 0.2 E.U./dL
pH, UA: 7.5 (ref 5.0–8.0)

## 2019-03-14 NOTE — Addendum Note (Signed)
Addended by: Fabian November on: 03/14/2019 06:51 PM   Modules accepted: Orders, SmartSet

## 2019-03-14 NOTE — Patient Instructions (Signed)
Cesarean Delivery  Cesarean birth, or cesarean delivery, is the surgical delivery of a baby through an incision in the abdomen and the uterus. This may be referred to as a C-section. This procedure may be scheduled ahead of time, or it may be done in an emergency situation.  Tell a health care provider about:   Any allergies you have.   All medicines you are taking, including vitamins, herbs, eye drops, creams, and over-the-counter medicines.   Any problems you or family members have had with anesthetic medicines.   Any blood disorders you have.   Any surgeries you have had.   Any medical conditions you have.   Whether you or any members of your family have a history of deep vein thrombosis (DVT) or pulmonary embolism (PE).  What are the risks?  Generally, this is a safe procedure. However, problems may occur, including:   Infection.   Bleeding.   Allergic reactions to medicines.   Damage to other structures or organs.   Blood clots.   Injury to your baby.  What happens before the procedure?  General instructions   Follow instructions from your health care provider about eating or drinking restrictions.   If you know that you are going to have a cesarean delivery, do not shave your pubic area. Shaving before the procedure may increase your risk of infection.   Plan to have someone take you home from the hospital.   Ask your health care provider what steps will be taken to prevent infection. These may include:  ? Removing hair at the surgery site.  ? Washing skin with a germ-killing soap.  ? Taking antibiotic medicine.   Depending on the reason for your cesarean delivery, you may have a physical exam or additional testing, such as an ultrasound.   You may have your blood or urine tested.  Questions for your health care provider   Ask your health care provider about:  ? Changing or stopping your regular medicines. This is especially important if you are taking diabetes medicines or blood  thinners.  ? Your pain management plan. This is especially important if you plan to breastfeed your baby.  ? How long you will be in the hospital after the procedure.  ? Any concerns you may have about receiving blood products, if you need them during the procedure.  ? Cord blood banking, if you plan to collect your baby's umbilical cord blood.   You may also want to ask your health care provider:  ? Whether you will be able to hold or breastfeed your baby while you are still in the operating room.  ? Whether your baby can stay with you immediately after the procedure and during your recovery.  ? Whether a family member or a person of your choice can go with you into the operating room and stay with you during the procedure, immediately after the procedure, and during your recovery.  What happens during the procedure?     An IV will be inserted into one of your veins.   Fluid and medicines, such as antibiotics, will be given before the surgery.   Fetal monitors will be placed on your abdomen to check your baby's heart rate.   You may be given a special warming gown to wear to keep your temperature stable.   A catheter may be inserted into your bladder through your urethra. This drains your urine during the procedure.   You may be given one or more of   the following:  ? A medicine to numb the area (local anesthetic).  ? A medicine to make you fall asleep (general anesthetic).  ? A medicine (regional anesthetic) that is injected into your back or through a small thin tube placed in your back (spinal anesthetic or epidural anesthetic). This numbs everything below the injection site and allows you to stay awake during your procedure. If this makes you feel nauseous, tell your health care provider. Medicines will be available to help reduce any nausea you may feel.   An incision will be made in your abdomen, and then in your uterus.   If you are awake during your procedure, you may feel tugging and pulling in  your abdomen, but you should not feel pain. If you feel pain, tell your health care provider immediately.   Your baby will be removed from your uterus. You may feel more pressure or pushing while this happens.   Immediately after birth, your baby will be dried and kept warm. You may be able to hold and breastfeed your baby.   The umbilical cord may be clamped and cut during this time. This usually occurs after waiting a period of 1-2 minutes after delivery.   Your placenta will be removed from your uterus.   Your incisions will be closed with stitches (sutures). Staples, skin glue, or adhesive strips may also be applied to the incision in your abdomen.   Bandages (dressings) may be placed over the incision in your abdomen.  The procedure may vary among health care providers and hospitals.  What happens after the procedure?   Your blood pressure, heart rate, breathing rate, and blood oxygen level will be monitored until you are discharged from the hospital.   You may continue to receive fluids and medicines through an IV.   You will have some pain. Medicines will be available to help control your pain.   To help prevent blood clots:  ? You may be given medicines.  ? You may have to wear compression stockings or devices.  ? You will be encouraged to walk around when you are able.   Hospital staff will encourage and support bonding with your baby. Your hospital may have you and your baby to stay in the same room (rooming in) during your hospital stay to encourage successful bonding and breastfeeding.   You may be encouraged to cough and breathe deeply often. This helps to prevent lung problems.   If you have a catheter draining your urine, it will be removed as soon as possible after your procedure.  Summary   Cesarean birth, or cesarean delivery, is the surgical delivery of a baby through an incision in the abdomen and the uterus.   Follow instructions from your health care provider about eating or  drinking restrictions before the procedure.   You will have some pain after the procedure. Medicines will be available to help control your pain.   Hospital staff will encourage and support bonding with your baby after the procedure. Your hospital may have you and your baby to stay in the same room (rooming in) during your hospital stay to encourage successful bonding and breastfeeding.  This information is not intended to replace advice given to you by your health care provider. Make sure you discuss any questions you have with your health care provider.  Document Released: 10/02/2005 Document Revised: 04/08/2018 Document Reviewed: 04/08/2018  Elsevier Interactive Patient Education  2019 [employer]

## 2019-03-14 NOTE — Progress Notes (Signed)
ROB: Referred from midwifery service as patient with h/o C-section x 1, previously desiring TOLAC but now has changed her mind and desires a repeat C-section. Prior C-section for failure to progress, CPD (baby was 9+ lbs). Patient complains of pelvic cramping, pain and vaginal pressure.  Discussed comfort measures.  The risks of cesarean section discussed with the patient included but were not limited to: bleeding which may require transfusion or reoperation; infection which may require antibiotics; injury to bowel, bladder, ureters or other surrounding organs; injury to the fetus; need for additional procedures including hysterectomy in the event of a life-threatening hemorrhage; placental abnormalities wth subsequent pregnancies, incisional problems, thromboembolic phenomenon and other postoperative/anesthesia complications. Patient has significant anemia of pregnancy, is to be scheduled for hematology visit next week for iron infusion.  Patient has a history of anemia in her second pregnancy and required a postpartum blood transfusion after her C-section. Discussed risks of needing another blood transfusion after this delivery. Patient notes understanding. Will schedule C-section for 03/31/2019.

## 2019-03-14 NOTE — Progress Notes (Signed)
ROB-Pt [redacted]w[redacted]d visiting today to discuss c-section options. Pt stated that she was having a lot of cramping in the abd area and a lot of pain and pressure in the vaginal area. Pt stated that she went shopping at [facility] about 2 days ago and had ot leave her cart due to the pain she was having.

## 2019-03-17 ENCOUNTER — Telehealth: Payer: Self-pay | Admitting: Oncology

## 2019-03-18 ENCOUNTER — Inpatient Hospital Stay: Payer: Medicaid Other | Attending: Oncology | Admitting: Oncology

## 2019-03-18 ENCOUNTER — Encounter: Payer: Self-pay | Admitting: Oncology

## 2019-03-18 ENCOUNTER — Other Ambulatory Visit: Payer: Self-pay

## 2019-03-18 DIAGNOSIS — K5903 Drug induced constipation: Secondary | ICD-10-CM

## 2019-03-18 DIAGNOSIS — D509 Iron deficiency anemia, unspecified: Secondary | ICD-10-CM

## 2019-03-18 DIAGNOSIS — R5383 Other fatigue: Secondary | ICD-10-CM | POA: Insufficient documentation

## 2019-03-18 DIAGNOSIS — K5909 Other constipation: Secondary | ICD-10-CM | POA: Insufficient documentation

## 2019-03-18 DIAGNOSIS — O99013 Anemia complicating pregnancy, third trimester: Secondary | ICD-10-CM | POA: Diagnosis not present

## 2019-03-18 NOTE — Progress Notes (Signed)
Patient contacted for telehealth visit. Patient's c-section scheduled on June 15.

## 2019-03-18 NOTE — Progress Notes (Signed)
HEMATOLOGY-ONCOLOGY TeleHEALTH VISIT INITIAL CONSULTATION  I connected with Joyce MillerKeyonna A Morrissey on 03/18/19 at  9:30 AM EDT by video enabled telemedicine visit and verified that I am speaking with the correct person using two identifiers. I discussed the limitations, risks, security and privacy concerns of performing an evaluation and management service by telemedicine and the availability of in-person appointments. I also discussed with the patient that there may be a patient responsible charge related to this service. The patient expressed understanding and agreed to proceed.   Other persons participating in the visit and their role in the encounter:  Merleen NicelyElizabeth Santos, RN, check in patient.   Patient's location: Home  Provider's location: Home office  Referring provider: Purcell NailsShambley, Melody N, CNM/Dr.Cherry Anika  Chief Complaint: Initial consultation for evaluation of iron deficiency anemia in pregnancy.  HISTORY OF PRESENT ILLNESS Joyce [name] is a 28 y.o. female who was seen in consultation at the request of Dr.Cherry Anika/ Shambley, Melody N, CNM for evaluation of iron deficiency anemia in pregnancy. Patient is currently in third trimester for her third child. Patient has history of anemia and her second pregnancy and required a postpartum blood transfusion after her C-section. Patient is scheduled to have C-section on 03/31/2019. Patient had labs done on 03/07/2019.  CBC showed hemoglobin 8.7, MCV 83, platelet count 239, WBC 10.7.  No differential was obtained. Ferritin was obtained and at the level of 6. Patient reports fatigue. Fatigue: reports worsening fatigue. Chronic onset, perisistent, no aggravating or improving factors.  No shortness of breath. Today he denies any pain. Patient has tried oral iron supplementation and is stopped due to constipation.  Review of Systems  Constitutional: Positive for fatigue. Negative for appetite change, chills and fever.  HENT:   Negative for  hearing loss and voice change.   Eyes: Negative for eye problems.  Respiratory: Negative for chest tightness and cough.   Cardiovascular: Negative for chest pain.  Gastrointestinal: Positive for constipation. Negative for abdominal distention, abdominal pain and blood in stool.  Endocrine: Negative for hot flashes.  Genitourinary: Negative for difficulty urinating and frequency.   Musculoskeletal: Negative for arthralgias.  Skin: Negative for itching and rash.  Neurological: Negative for extremity weakness.  Hematological: Negative for adenopathy.  Psychiatric/Behavioral: Negative for confusion.    Past Medical History:  Diagnosis Date  . Anemia   . Hidradenitis axillaris    Past Surgical History:  Procedure Laterality Date  . CESAREAN SECTION    . INCISION AND DRAINAGE BREAST ABSCESS Left 11/2014  . OTHER SURGICAL HISTORY N/A    had c section, blood transfusion    Family History  Problem Relation Age of Onset  . Migraines Mother   . Stroke Maternal Grandmother   . Cancer Maternal Grandfather 70       throat  . Cancer Paternal Grandmother     Social History   Socioeconomic History  . Marital status: Single    Spouse name: Not on file  . Number of children: Not on file  . Years of education: Not on file  . Highest education level: Not on file  Occupational History  . Not on file  Social Needs  . Financial resource strain: Not on file  . Food insecurity:    Worry: Not on file    Inability: Not on file  . Transportation needs:    Medical: Not on file    Non-medical: Not on file  Tobacco Use  . Smoking status: Never Smoker  . Smokeless tobacco: Never  Used  Substance and Sexual Activity  . Alcohol use: Not Currently    Alcohol/week: 0.0 standard drinks    Comment: occa  . Drug use: Not Currently    Types: Marijuana  . Sexual activity: Not Currently    Birth control/protection: Other-see comments    Comment: still deciding  Lifestyle  . Physical activity:     Days per week: Not on file    Minutes per session: Not on file  . Stress: Not on file  Relationships  . Social connections:    Talks on phone: Not on file    Gets together: Not on file    Attends religious service: Not on file    Active member of club or organization: Not on file    Attends meetings of clubs or organizations: Not on file    Relationship status: Not on file  . Intimate partner violence:    Fear of current or ex partner: Not on file    Emotionally abused: Not on file    Physically abused: Not on file    Forced sexual activity: Not on file  Other Topics Concern  . Not on file  Social History Narrative  . Not on file    Current Outpatient Medications on File Prior to Visit  Medication Sig Dispense Refill  . Prenatal-DSS-FeCb-FeGl-FA (CITRANATAL BLOOM) 90-1 MG TABS Take 1 tablet by mouth daily. 30 tablet 4   No current facility-administered medications on file prior to visit.     Allergies  Allergen Reactions  . Sulfa Antibiotics Hives       Observations/Objective: Today's Vitals   03/18/19 0914  PainSc: 0-No pain   There is no height or weight on file to calculate BMI.  Physical Exam  Constitutional: She is oriented to person, place, and time. No distress.  HENT:  Head: Normocephalic and atraumatic.  Pulmonary/Chest: Effort normal.  Neurological: She is alert and oriented to person, place, and time.  Psychiatric: Affect normal.    I have personally reviewed below laboratory results.  CBC    Component Value Date/Time   WBC 10.7 03/07/2019 1602   WBC 15.8 (H) 01/13/2019 1757   RBC 3.23 (L) 03/07/2019 1602   RBC 3.47 (L) 01/13/2019 1757   HGB 8.7 (L) 03/07/2019 1602   HCT 26.9 (L) 03/07/2019 1602   PLT 239 03/07/2019 1602   MCV 83 03/07/2019 1602   MCV 87 06/24/2014 2105   MCH 26.9 03/07/2019 1602   MCH 27.4 01/13/2019 1757   MCHC 32.3 03/07/2019 1602   MCHC 31.3 01/13/2019 1757   RDW 15.5 (H) 03/07/2019 1602   RDW 19.4 (H) 06/24/2014 2105    LYMPHSABS 0.8 01/13/2019 1757   LYMPHSABS 2.1 11/18/2018 1007   LYMPHSABS 1.4 06/24/2014 2105   MONOABS 0.4 01/13/2019 1757   MONOABS 0.5 06/24/2014 2105   EOSABS 0.0 01/13/2019 1757   EOSABS 0.4 11/18/2018 1007   EOSABS 0.1 06/24/2014 2105   BASOSABS 0.0 01/13/2019 1757   BASOSABS 0.0 11/18/2018 1007   BASOSABS 0.0 06/24/2014 2105    CMP     Component Value Date/Time   NA 136 01/13/2019 1757   NA 141 05/28/2014 0910   K 3.5 01/13/2019 1757   K 3.5 05/28/2014 0910   CL 105 01/13/2019 1757   CL 108 (H) 05/28/2014 0910   CO2 20 (L) 01/13/2019 1757   CO2 24 05/28/2014 0910   GLUCOSE 100 (H) 01/13/2019 1757   GLUCOSE 86 05/28/2014 0910   BUN 7 01/13/2019 1757  BUN 4 (L) 05/28/2014 0910   CREATININE 0.54 01/13/2019 1757   CREATININE 0.59 (L) 05/28/2014 0910   CALCIUM 8.8 (L) 01/13/2019 1757   CALCIUM 7.8 (L) 05/28/2014 0910   PROT 7.6 01/13/2019 1757   PROT 6.0 (L) 05/28/2014 0910   ALBUMIN 3.6 01/13/2019 1757   ALBUMIN 2.2 (L) 05/28/2014 0910   AST 16 01/13/2019 1757   AST 13 (L) 05/28/2014 0910   ALT 8 01/13/2019 1757   ALT 10 (L) 05/28/2014 0910   ALKPHOS 75 01/13/2019 1757   ALKPHOS 129 (H) 05/28/2014 0910   BILITOT 0.2 (L) 01/13/2019 1757   BILITOT 0.5 05/28/2014 0910   GFRNONAA >60 01/13/2019 1757   GFRNONAA >60 05/28/2014 0910   GFRAA >60 01/13/2019 1757   GFRAA >60 05/28/2014 0910     RADIOGRAPHIC STUDIES: I have personally reviewed the radiological images as listed and agreed with the findings in the report.  No results found.     Assessment and Plan: 1. Anemia of mother in pregnancy, third trimester   2. Iron deficiency anemia, unspecified iron deficiency anemia type   3. Drug-induced constipation     Labs reviewed and discussed with patient. She will have a C-section in 2 weeks.  Recommend IV iron to optimize iron store/anemia prior to the C-section. Recommend obtain CBC with differential, smear, CMP.  Plan IV iron with Venofer  weekly x 4  doses. Allergy reactions/infusion reaction including anaphylactic reaction discussed with patient. Other side effects include but not limited to high blood pressure, skin rash, weight gain, leg swelling,etc. Sometimes, emotional changes/anxiety during the infusion may induce labor. etc. Patient voices understanding and willing to proceed. Plan weekly x 2 doses prior to C section, postpartum, will proceed another 2 doses.  I also encourage patient to continue take her oral iron supplementation, recommend taking colace  daily as stool softer.    Follow Up Instructions: 8 weeks to repeat labs, MD assessment for additional IV Iron.    I discussed the assessment and treatment plan with the patient. The patient was provided an opportunity to ask questions and all were answered. The patient agreed with the plan and demonstrated an understanding of the instructions.  The patient was advised to call back or seek an in-person evaluation if the symptoms worsen or if the condition fails to improve as anticipated.    Rickard Patience, MD 03/18/2019 3:19 PM

## 2019-03-19 ENCOUNTER — Other Ambulatory Visit: Payer: Self-pay

## 2019-03-19 ENCOUNTER — Inpatient Hospital Stay: Payer: Medicaid Other

## 2019-03-19 DIAGNOSIS — D509 Iron deficiency anemia, unspecified: Secondary | ICD-10-CM | POA: Diagnosis not present

## 2019-03-19 DIAGNOSIS — R5383 Other fatigue: Secondary | ICD-10-CM | POA: Diagnosis not present

## 2019-03-19 DIAGNOSIS — O99013 Anemia complicating pregnancy, third trimester: Secondary | ICD-10-CM | POA: Diagnosis not present

## 2019-03-19 DIAGNOSIS — K5903 Drug induced constipation: Secondary | ICD-10-CM | POA: Diagnosis not present

## 2019-03-19 DIAGNOSIS — K5909 Other constipation: Secondary | ICD-10-CM | POA: Diagnosis not present

## 2019-03-19 LAB — CBC WITH DIFFERENTIAL/PLATELET
Abs Immature Granulocytes: 0.05 10*3/uL (ref 0.00–0.07)
Basophils Absolute: 0 10*3/uL (ref 0.0–0.1)
Basophils Relative: 0 %
Eosinophils Absolute: 0.2 10*3/uL (ref 0.0–0.5)
Eosinophils Relative: 2 %
HCT: 27.4 % — ABNORMAL LOW (ref 36.0–46.0)
Hemoglobin: 8.6 g/dL — ABNORMAL LOW (ref 12.0–15.0)
Immature Granulocytes: 0 %
Lymphocytes Relative: 17 %
Lymphs Abs: 1.9 10*3/uL (ref 0.7–4.0)
MCH: 25.7 pg — ABNORMAL LOW (ref 26.0–34.0)
MCHC: 31.4 g/dL (ref 30.0–36.0)
MCV: 81.8 fL (ref 80.0–100.0)
Monocytes Absolute: 0.5 10*3/uL (ref 0.1–1.0)
Monocytes Relative: 5 %
Neutro Abs: 8.4 10*3/uL — ABNORMAL HIGH (ref 1.7–7.7)
Neutrophils Relative %: 76 %
Platelets: 215 10*3/uL (ref 150–400)
RBC: 3.35 MIL/uL — ABNORMAL LOW (ref 3.87–5.11)
RDW: 16.1 % — ABNORMAL HIGH (ref 11.5–15.5)
WBC: 11.1 10*3/uL — ABNORMAL HIGH (ref 4.0–10.5)
nRBC: 0 % (ref 0.0–0.2)

## 2019-03-19 LAB — COMPREHENSIVE METABOLIC PANEL
ALT: 9 U/L (ref 0–44)
AST: 14 U/L — ABNORMAL LOW (ref 15–41)
Albumin: 3 g/dL — ABNORMAL LOW (ref 3.5–5.0)
Alkaline Phosphatase: 130 U/L — ABNORMAL HIGH (ref 38–126)
Anion gap: 9 (ref 5–15)
BUN: 5 mg/dL — ABNORMAL LOW (ref 6–20)
CO2: 22 mmol/L (ref 22–32)
Calcium: 8.5 mg/dL — ABNORMAL LOW (ref 8.9–10.3)
Chloride: 106 mmol/L (ref 98–111)
Creatinine, Ser: 0.7 mg/dL (ref 0.44–1.00)
GFR calc Af Amer: >60 mL/min (ref >60)
GFR calc non Af Amer: >60 mL/min (ref >60)
Glucose, Bld: 76 mg/dL (ref 70–99)
Potassium: 3.9 mmol/L (ref 3.5–5.1)
Sodium: 137 mmol/L (ref 135–145)
Total Bilirubin: 0.4 mg/dL (ref 0.3–1.2)
Total Protein: 6.9 g/dL (ref 6.5–8.1)

## 2019-03-20 ENCOUNTER — Inpatient Hospital Stay: Payer: Medicaid Other

## 2019-03-20 ENCOUNTER — Other Ambulatory Visit: Payer: Self-pay

## 2019-03-20 ENCOUNTER — Telehealth: Payer: Self-pay | Admitting: *Deleted

## 2019-03-20 ENCOUNTER — Other Ambulatory Visit: Payer: Medicaid Other

## 2019-03-20 VITALS — BP 125/70 | HR 90 | Temp 97.3°F | Resp 20

## 2019-03-20 DIAGNOSIS — D509 Iron deficiency anemia, unspecified: Secondary | ICD-10-CM

## 2019-03-20 DIAGNOSIS — O99013 Anemia complicating pregnancy, third trimester: Secondary | ICD-10-CM

## 2019-03-20 MED ORDER — IRON SUCROSE 20 MG/ML IV SOLN
200.0000 mg | Freq: Once | INTRAVENOUS | Status: AC
  Administered 2019-03-20: 200 mg via INTRAVENOUS
  Filled 2019-03-20: qty 10

## 2019-03-20 MED ORDER — SODIUM CHLORIDE 0.9 % IV SOLN
Freq: Once | INTRAVENOUS | Status: AC
  Administered 2019-03-20: 14:00:00 via INTRAVENOUS
  Filled 2019-03-20: qty 250

## 2019-03-20 NOTE — Telephone Encounter (Signed)
Coronavirus (COVID-19) Are you at risk?  Are you at risk for the Coronavirus (COVID-19)?  To be considered HIGH RISK for Coronavirus (COVID-19), you have to meet the following criteria:  . Traveled to [place], [place], [place], [place] or [place]; or in the [place] to [place], [place], [place], or [place]; and have fever, cough, and shortness of breath within the last 2 weeks of travel OR . Been in close contact with a person diagnosed with COVID-19 within the last 2 weeks and have fever, cough, and shortness of breath . IF YOU DO NOT MEET THESE CRITERIA, YOU ARE CONSIDERED LOW RISK FOR COVID-19.  What to do if you are HIGH RISK for COVID-19?  . If you are having a medical emergency, call 911. . Seek medical care right away. Before you go to a doctor's office, urgent care or emergency department, call ahead and tell them about your recent travel, contact with someone diagnosed with COVID-19, and your symptoms. You should receive instructions from your physician's office regarding next steps of care.  . When you arrive at healthcare provider, tell the healthcare staff immediately you have returned from visiting [place], [place], [place], [place] or [place]; or traveled in the [place] to [place], [place], [place], or [place]; in the last two weeks or you have been in close contact with a person diagnosed with COVID-19 in the last 2 weeks.   . Tell the health care staff about your symptoms: fever, cough and shortness of breath. . After you have been seen by a medical provider, you will be either: o Tested for (COVID-19) and discharged home on quarantine except to seek medical care if symptoms worsen, and asked to  - Stay home and avoid contact with others until you get your results (4-5 days)  - Avoid travel on public transportation if possible (such as bus, train, or airplane) or o Sent to the Emergency Department by EMS for evaluation, COVID-19 testing, and possible  admission depending on your condition and test results.  What to do if you are LOW RISK for COVID-19?  Reduce your risk of any infection by using the same precautions used for avoiding the common cold or flu:  . Wash your hands often with soap and warm water for at least 20 seconds.  If soap and water are not readily available, use an alcohol-based hand sanitizer with at least 60% alcohol.  . If coughing or sneezing, cover your mouth and nose by coughing or sneezing into the elbow areas of your shirt or coat, into a tissue or into your sleeve (not your hands). . Avoid shaking hands with others and consider head nods or verbal greetings only. . Avoid touching your eyes, nose, or mouth with unwashed hands.  . Avoid close contact with people who are sick. . Avoid places or events with large numbers of people in one location, like concerts or sporting events. . Carefully consider travel plans you have or are making. . If you are planning any travel outside or inside the [place], visit the CDC's Travelers' Health webpage for the latest health notices. . If you have some symptoms but not all symptoms, continue to monitor at home and seek medical attention if your symptoms worsen. . If you are having a medical emergency, call 911.   ADDITIONAL HEALTHCARE OPTIONS FOR PATIENTS  [place] [facility] / e-Visit: [email or web address]         [facility]: [phone]  [facility] [facility]: [phone]                   [facility]: [phone]   Spoke with pt denies any sx.  Enos Muhl, CMA 

## 2019-03-21 ENCOUNTER — Ambulatory Visit (INDEPENDENT_AMBULATORY_CARE_PROVIDER_SITE_OTHER): Payer: Medicaid Other | Admitting: Certified Nurse Midwife

## 2019-03-21 VITALS — BP 94/61 | HR 83 | Wt 191.8 lb

## 2019-03-21 DIAGNOSIS — Z3493 Encounter for supervision of normal pregnancy, unspecified, third trimester: Secondary | ICD-10-CM | POA: Diagnosis not present

## 2019-03-21 DIAGNOSIS — Z23 Encounter for immunization: Secondary | ICD-10-CM | POA: Diagnosis not present

## 2019-03-21 DIAGNOSIS — Z131 Encounter for screening for diabetes mellitus: Secondary | ICD-10-CM

## 2019-03-21 LAB — POCT URINALYSIS DIPSTICK OB
Bilirubin, UA: NEGATIVE
Blood, UA: NEGATIVE
Glucose, UA: NEGATIVE
Ketones, UA: NEGATIVE
Nitrite, UA: NEGATIVE
Spec Grav, UA: 1.01 (ref 1.010–1.025)
Urobilinogen, UA: 0.2 E.U./dL
pH, UA: 8.5 — AB (ref 5.0–8.0)

## 2019-03-21 MED ORDER — TETANUS-DIPHTH-ACELL PERTUSSIS 5-2.5-18.5 LF-MCG/0.5 IM SUSP
0.5000 mL | Freq: Once | INTRAMUSCULAR | Status: AC
  Administered 2019-03-21: 0.5 mL via INTRAMUSCULAR

## 2019-03-21 NOTE — Progress Notes (Signed)
ROB-Doing well. First hematology appt yesterday, next 03/27/19. Rpt c-section scheduled with Dr Valentino Saxon on 03/31/19 at 1035. Advised to await call from pre-admit testing. TDaP given today. Blood transfusion consent reviewed and signed. Missed glucola testing, will collect A1c today; see orders. Anticipatory guidance regarding course of prenatal care. Reviewed red flag symptoms and when to call. RTC x 1 week for ROB or sooner if needed.

## 2019-03-21 NOTE — Patient Instructions (Signed)

## 2019-03-21 NOTE — Progress Notes (Signed)
ROB-No complaints.  

## 2019-03-22 LAB — HEMOGLOBIN A1C
Est. average glucose Bld gHb Est-mCnc: 105 mg/dL
Hgb A1c MFr Bld: 5.3 % (ref 4.8–5.6)

## 2019-03-26 ENCOUNTER — Other Ambulatory Visit: Payer: Self-pay

## 2019-03-27 ENCOUNTER — Inpatient Hospital Stay: Payer: Medicaid Other

## 2019-03-27 ENCOUNTER — Encounter
Admission: RE | Admit: 2019-03-27 | Discharge: 2019-03-27 | Disposition: A | Discharge status: Home / Self Care | Payer: Medicaid Other | Source: Ambulatory Visit | Attending: Obstetrics and Gynecology | Admitting: Obstetrics and Gynecology

## 2019-03-27 ENCOUNTER — Other Ambulatory Visit: Payer: Self-pay

## 2019-03-27 ENCOUNTER — Telehealth: Payer: Self-pay | Admitting: *Deleted

## 2019-03-27 VITALS — BP 113/78 | HR 82 | Temp 97.0°F | Resp 20

## 2019-03-27 DIAGNOSIS — Z1159 Encounter for screening for other viral diseases: Secondary | ICD-10-CM | POA: Insufficient documentation

## 2019-03-27 DIAGNOSIS — D509 Iron deficiency anemia, unspecified: Secondary | ICD-10-CM | POA: Diagnosis not present

## 2019-03-27 DIAGNOSIS — Z01812 Encounter for preprocedural laboratory examination: Secondary | ICD-10-CM | POA: Diagnosis not present

## 2019-03-27 DIAGNOSIS — O99013 Anemia complicating pregnancy, third trimester: Secondary | ICD-10-CM

## 2019-03-27 MED ORDER — IRON SUCROSE 20 MG/ML IV SOLN
200.0000 mg | Freq: Once | INTRAVENOUS | Status: AC
  Administered 2019-03-27: 200 mg via INTRAVENOUS
  Filled 2019-03-27: qty 10

## 2019-03-27 NOTE — Patient Instructions (Signed)
Your procedure is scheduled on: 03/31/19 0830 Report to  MEDICAL MALL REGISTRATION   Remember: Instructions that are not followed completely may result in serious medical risk,  up to and including death, or upon the discretion of your surgeon and anesthesiologist your  surgery may need to be rescheduled.     _X__ 1. Do not eat food after midnight the night before your procedure.                 No gum chewing or hard candies. You may drink clear liquids up to 2 hours                 before you are scheduled to arrive for your surgery- DO not drink clear                 liquids within 2 hours of the start of your surgery.                 Clear Liquids include:  water, apple juice without pulp, clear carbohydrate                 drink such as Clearfast of Gatorade, Black Coffee or Tea (Do not add                 anything to coffee or tea). COMPLETE CARB DRINK BY 6:30 AM  __X__2.  On the morning of surgery brush your teeth with toothpaste and water, you                may rinse your mouth with mouthwash if you wish.  Do not swallow any toothpaste of mouthwash.     _X__ 3.  No Alcohol for 24 hours before or after surgery.   _X__ 4.  Do Not Smoke or use e-cigarettes For 24 Hours Prior to Your Surgery.                 Do not use any chewable tobacco products for at least 6 hours prior to                 surgery.  ____  5.  Bring all medications with you on the day of surgery if instructed.   __X__  6.  Notify your doctor if there is any change in your medical condition      (cold, fever, infections).     Do not wear jewelry, make-up, hairpins, clips or nail polish. Do not wear lotions, powders, or perfumes. You may wear deodorant. Do not shave 48 hours prior to surgery. Men may shave face and neck. Do not bring valuables to the hospital.    [facility] is not responsible for any belongings or valuables.  Contacts, dentures or bridgework may not be worn into  surgery. Leave your suitcase in the car. After surgery it may be brought to your room. For patients admitted to the hospital, discharge time is determined by your treatment team.   Patients discharged the day of surgery will not be allowed to drive home.   Please read over the following fact sheets that you were given:   Surgical Site Infection Prevention / SPIROMETRY .Marland Kitchen.. PRACTICE WITH SPIROMETRY AND BRING BACK DAY OF SURGERY DO NOT PUT CHG SOAP ON BREASTS         ____ Take these medicines the morning of surgery with A SIP OF WATER:    1.NONE  2.   3.   4.  5.  6.  ____ Fleet Enema (as directed)   _X___ Use CHG Soap as directed  DO NOT PUT ON BREASTS  ____ Use inhalers on the day of surgery  ____ Stop metformin 2 days prior to surgery    ____ Take 1/2 of usual insulin dose the night before surgery. No insulin the morning          of surgery.   ____ Stop Coumadin/Plavix/aspirin on   ____ Stop Anti-inflammatories on    ____ Stop supplements until after surgery.    ____ Bring C-Pap to the hospital.

## 2019-03-27 NOTE — Telephone Encounter (Signed)
Coronavirus (COVID-19) Are you at risk?  Are you at risk for the Coronavirus (COVID-19)?  To be considered HIGH RISK for Coronavirus (COVID-19), you have to meet the following criteria:  . Traveled to [place], [place], [place], [place] or [place]; or in the [place] to [place], [place], [place], or [place]; and have fever, cough, and shortness of breath within the last 2 weeks of travel OR . Been in close contact with a person diagnosed with COVID-19 within the last 2 weeks and have fever, cough, and shortness of breath . IF YOU DO NOT MEET THESE CRITERIA, YOU ARE CONSIDERED LOW RISK FOR COVID-19.  What to do if you are HIGH RISK for COVID-19?  . If you are having a medical emergency, call 911. . Seek medical care right away. Before you go to a doctor's office, urgent care or emergency department, call ahead and tell them about your recent travel, contact with someone diagnosed with COVID-19, and your symptoms. You should receive instructions from your physician's office regarding next steps of care.  . When you arrive at healthcare provider, tell the healthcare staff immediately you have returned from visiting [place], [place], [place], [place] or [place]; or traveled in the [place] to [place], [place], [place], or [place]; in the last two weeks or you have been in close contact with a person diagnosed with COVID-19 in the last 2 weeks.   . Tell the health care staff about your symptoms: fever, cough and shortness of breath. . After you have been seen by a medical provider, you will be either: o Tested for (COVID-19) and discharged home on quarantine except to seek medical care if symptoms worsen, and asked to  - Stay home and avoid contact with others until you get your results (4-5 days)  - Avoid travel on public transportation if possible (such as bus, train, or airplane) or o Sent to the Emergency Department by EMS for evaluation, COVID-19 testing, and possible  admission depending on your condition and test results.  What to do if you are LOW RISK for COVID-19?  Reduce your risk of any infection by using the same precautions used for avoiding the common cold or flu:  . Wash your hands often with soap and warm water for at least 20 seconds.  If soap and water are not readily available, use an alcohol-based hand sanitizer with at least 60% alcohol.  . If coughing or sneezing, cover your mouth and nose by coughing or sneezing into the elbow areas of your shirt or coat, into a tissue or into your sleeve (not your hands). . Avoid shaking hands with others and consider head nods or verbal greetings only. . Avoid touching your eyes, nose, or mouth with unwashed hands.  . Avoid close contact with people who are sick. . Avoid places or events with large numbers of people in one location, like concerts or sporting events. . Carefully consider travel plans you have or are making. . If you are planning any travel outside or inside the [place], visit the CDC's Travelers' Health webpage for the latest health notices. . If you have some symptoms but not all symptoms, continue to monitor at home and seek medical attention if your symptoms worsen. . If you are having a medical emergency, call 911.   ADDITIONAL HEALTHCARE OPTIONS FOR PATIENTS  [facility] / e-Visit: [email or web address]         [facility]: [phone]  [place]   [facility]: [phone]                   [facility]: [phone]   Spoke with pt denies any sx.  Jax Abdelrahman, CMA 

## 2019-03-28 ENCOUNTER — Encounter: Payer: Medicaid Other | Admitting: Certified Nurse Midwife

## 2019-03-28 ENCOUNTER — Ambulatory Visit
Admission: RE | Admit: 2019-03-28 | Discharge: 2019-03-28 | Disposition: A | Discharge status: Home / Self Care | Payer: Medicaid Other | Source: Ambulatory Visit

## 2019-03-28 ENCOUNTER — Other Ambulatory Visit: Payer: Self-pay

## 2019-03-28 LAB — NOVEL CORONAVIRUS, NAA (HOSP ORDER, SEND-OUT TO REF LAB; TAT 18-24 HRS): SARS-CoV-2, NAA: NOT DETECTED

## 2019-03-31 ENCOUNTER — Other Ambulatory Visit: Payer: Self-pay

## 2019-03-31 ENCOUNTER — Inpatient Hospital Stay: Payer: Medicaid Other | Admitting: Certified Registered Nurse Anesthetist

## 2019-03-31 ENCOUNTER — Encounter: Payer: Self-pay | Admitting: Certified Registered Nurse Anesthetist

## 2019-03-31 ENCOUNTER — Encounter
Admission: RE | Disposition: A | Discharge status: Home / Self Care | Payer: Self-pay | Source: Home / Self Care | Attending: Obstetrics and Gynecology

## 2019-03-31 ENCOUNTER — Inpatient Hospital Stay
Admission: RE | Admit: 2019-03-31 | Discharge: 2019-04-02 | DRG: 788 | Disposition: A | Discharge status: Home / Self Care | Payer: Medicaid Other | Attending: Obstetrics and Gynecology | Admitting: Obstetrics and Gynecology

## 2019-03-31 DIAGNOSIS — D509 Iron deficiency anemia, unspecified: Secondary | ICD-10-CM | POA: Diagnosis present

## 2019-03-31 DIAGNOSIS — O09293 Supervision of pregnancy with other poor reproductive or obstetric history, third trimester: Secondary | ICD-10-CM

## 2019-03-31 DIAGNOSIS — Z3A39 39 weeks gestation of pregnancy: Secondary | ICD-10-CM

## 2019-03-31 DIAGNOSIS — O9902 Anemia complicating childbirth: Secondary | ICD-10-CM | POA: Diagnosis present

## 2019-03-31 DIAGNOSIS — Z9289 Personal history of other medical treatment: Secondary | ICD-10-CM

## 2019-03-31 DIAGNOSIS — O34211 Maternal care for low transverse scar from previous cesarean delivery: Principal | ICD-10-CM | POA: Diagnosis present

## 2019-03-31 DIAGNOSIS — Z98891 History of uterine scar from previous surgery: Secondary | ICD-10-CM

## 2019-03-31 LAB — RAPID HIV SCREEN (HIV 1/2 AB+AG)
HIV 1/2 Antibodies: NONREACTIVE
HIV-1 P24 Antigen - HIV24: NONREACTIVE

## 2019-03-31 LAB — URINE DRUG SCREEN, QUALITATIVE (ARMC ONLY)
Amphetamines, Ur Screen: NOT DETECTED
Barbiturates, Ur Screen: NOT DETECTED
Benzodiazepine, Ur Scrn: NOT DETECTED
Cannabinoid 50 Ng, Ur ~~LOC~~: POSITIVE — AB
Cocaine Metabolite,Ur ~~LOC~~: NOT DETECTED
MDMA (Ecstasy)Ur Screen: NOT DETECTED
Methadone Scn, Ur: NOT DETECTED
Opiate, Ur Screen: NOT DETECTED
Phencyclidine (PCP) Ur S: NOT DETECTED
Tricyclic, Ur Screen: NOT DETECTED

## 2019-03-31 LAB — TYPE AND SCREEN
ABO/RH(D): AB POS
Antibody Screen: NEGATIVE

## 2019-03-31 LAB — CBC
HCT: 30.8 % — ABNORMAL LOW (ref 36.0–46.0)
Hemoglobin: 9.5 g/dL — ABNORMAL LOW (ref 12.0–15.0)
MCH: 26.4 pg (ref 26.0–34.0)
MCHC: 30.8 g/dL (ref 30.0–36.0)
MCV: 85.6 fL (ref 80.0–100.0)
Platelets: 223 10*3/uL (ref 150–400)
RBC: 3.6 MIL/uL — ABNORMAL LOW (ref 3.87–5.11)
RDW: 19.5 % — ABNORMAL HIGH (ref 11.5–15.5)
WBC: 11.7 10*3/uL — ABNORMAL HIGH (ref 4.0–10.5)
nRBC: 0 % (ref 0.0–0.2)

## 2019-03-31 SURGERY — Surgical Case
Anesthesia: Spinal

## 2019-03-31 MED ORDER — OXYCODONE HCL 5 MG PO TABS: 5.0000 mg | ORAL_TABLET | Freq: Once | ORAL | Status: DC | PRN

## 2019-03-31 MED ORDER — SIMETHICONE 80 MG PO CHEW
80.0000 mg | CHEWABLE_TABLET | ORAL | Status: DC
  Administered 2019-04-01 – 2019-04-02 (×2): 80 mg via ORAL
  Filled 2019-03-31 (×2): qty 1

## 2019-03-31 MED ORDER — ONDANSETRON HCL 4 MG/2ML IJ SOLN: 4.0000 mg | Freq: Three times a day (TID) | INTRAMUSCULAR | Status: DC | PRN

## 2019-03-31 MED ORDER — DEXAMETHASONE SODIUM PHOSPHATE 10 MG/ML IJ SOLN
INTRAMUSCULAR | Status: AC
  Filled 2019-03-31: qty 1

## 2019-03-31 MED ORDER — LIDOCAINE 5 % EX PTCH
MEDICATED_PATCH | CUTANEOUS | Status: AC
  Filled 2019-03-31: qty 1

## 2019-03-31 MED ORDER — DIPHENHYDRAMINE HCL 50 MG/ML IJ SOLN: 12.5000 mg | INTRAMUSCULAR | Status: DC | PRN

## 2019-03-31 MED ORDER — NALBUPHINE HCL 10 MG/ML IJ SOLN: 5.0000 mg | Freq: Once | INTRAMUSCULAR | Status: DC | PRN

## 2019-03-31 MED ORDER — MEPERIDINE HCL 25 MG/ML IJ SOLN: 6.2500 mg | INTRAMUSCULAR | Status: DC | PRN

## 2019-03-31 MED ORDER — TRAMADOL HCL 50 MG PO TABS
50.0000 mg | ORAL_TABLET | Freq: Four times a day (QID) | ORAL | Status: DC | PRN
  Administered 2019-04-01 – 2019-04-02 (×2): 50 mg via ORAL
  Filled 2019-03-31 (×2): qty 1

## 2019-03-31 MED ORDER — SODIUM CHLORIDE 0.9 % IV SOLN
INTRAVENOUS | Status: DC | PRN
  Administered 2019-03-31: 40 ug/min via INTRAVENOUS

## 2019-03-31 MED ORDER — DEXAMETHASONE SODIUM PHOSPHATE 10 MG/ML IJ SOLN
INTRAMUSCULAR | Status: DC | PRN
  Administered 2019-03-31: 10 mg via INTRAVENOUS

## 2019-03-31 MED ORDER — SOD CITRATE-CITRIC ACID 500-334 MG/5ML PO SOLN
30.0000 mL | ORAL | Status: AC
  Administered 2019-03-31: 30 mL via ORAL
  Filled 2019-03-31: qty 30

## 2019-03-31 MED ORDER — KETOROLAC TROMETHAMINE 30 MG/ML IJ SOLN: 30.0000 mg | Freq: Four times a day (QID) | INTRAMUSCULAR | Status: DC

## 2019-03-31 MED ORDER — DIPHENHYDRAMINE HCL 25 MG PO CAPS: 25.0000 mg | ORAL_CAPSULE | Freq: Four times a day (QID) | ORAL | Status: DC | PRN

## 2019-03-31 MED ORDER — BUPIVACAINE IN DEXTROSE 0.75-8.25 % IT SOLN
INTRATHECAL | Status: DC | PRN
  Administered 2019-03-31: 1.4 mL via INTRATHECAL

## 2019-03-31 MED ORDER — FENTANYL CITRATE (PF) 100 MCG/2ML IJ SOLN
25.0000 ug | INTRAMUSCULAR | Status: DC | PRN
  Administered 2019-03-31 (×2): 25 ug via INTRAVENOUS
  Filled 2019-03-31: qty 2

## 2019-03-31 MED ORDER — OXYCODONE HCL 5 MG/5ML PO SOLN
5.0000 mg | Freq: Once | ORAL | Status: DC | PRN
  Filled 2019-03-31: qty 5

## 2019-03-31 MED ORDER — ACETAMINOPHEN 500 MG PO TABS
1000.0000 mg | ORAL_TABLET | ORAL | Status: AC
  Administered 2019-03-31: 10:00:00 1000 mg via ORAL
  Filled 2019-03-31: qty 2

## 2019-03-31 MED ORDER — NALBUPHINE HCL 10 MG/ML IJ SOLN: 5.0000 mg | INTRAMUSCULAR | Status: DC | PRN

## 2019-03-31 MED ORDER — KETOROLAC TROMETHAMINE 30 MG/ML IJ SOLN: 30.0000 mg | Freq: Once | INTRAMUSCULAR | Status: DC

## 2019-03-31 MED ORDER — MAGNESIUM HYDROXIDE 400 MG/5ML PO SUSP: 30.0000 mL | ORAL | Status: DC | PRN

## 2019-03-31 MED ORDER — MORPHINE SULFATE (PF) 0.5 MG/ML IJ SOLN
INTRAMUSCULAR | Status: DC | PRN
  Administered 2019-03-31: .1 mg via EPIDURAL

## 2019-03-31 MED ORDER — OXYTOCIN 40 UNITS IN NORMAL SALINE INFUSION - SIMPLE MED
INTRAVENOUS | Status: AC
  Filled 2019-03-31: qty 1000

## 2019-03-31 MED ORDER — MENTHOL 3 MG MT LOZG: 1.0000 | LOZENGE | OROMUCOSAL | Status: DC | PRN

## 2019-03-31 MED ORDER — KETOROLAC TROMETHAMINE 30 MG/ML IJ SOLN
INTRAMUSCULAR | Status: AC
  Administered 2019-03-31: 13:00:00 30 mg via INTRAVENOUS
  Filled 2019-03-31: qty 1

## 2019-03-31 MED ORDER — ACETAMINOPHEN 325 MG PO TABS
650.0000 mg | ORAL_TABLET | Freq: Four times a day (QID) | ORAL | Status: AC
  Administered 2019-03-31 – 2019-04-01 (×4): 650 mg via ORAL
  Filled 2019-03-31 (×4): qty 2

## 2019-03-31 MED ORDER — LIDOCAINE 5 % EX PTCH
MEDICATED_PATCH | CUTANEOUS | Status: DC | PRN
  Administered 2019-03-31: 1 via TRANSDERMAL

## 2019-03-31 MED ORDER — FERROUS SULFATE 325 (65 FE) MG PO TABS
325.0000 mg | ORAL_TABLET | Freq: Two times a day (BID) | ORAL | Status: DC
  Administered 2019-03-31 – 2019-04-02 (×5): 325 mg via ORAL
  Filled 2019-03-31 (×5): qty 1

## 2019-03-31 MED ORDER — KETOROLAC TROMETHAMINE 30 MG/ML IJ SOLN
30.0000 mg | Freq: Four times a day (QID) | INTRAMUSCULAR | Status: DC
  Administered 2019-03-31: 13:00:00 30 mg via INTRAVENOUS

## 2019-03-31 MED ORDER — LIDOCAINE HCL (PF) 1 % IJ SOLN
INTRAMUSCULAR | Status: DC | PRN
  Administered 2019-03-31: 3 mL via SUBCUTANEOUS

## 2019-03-31 MED ORDER — PHENYLEPHRINE HCL (PRESSORS) 10 MG/ML IV SOLN
INTRAVENOUS | Status: DC | PRN
  Administered 2019-03-31 (×2): 100 ug via INTRAVENOUS

## 2019-03-31 MED ORDER — COCONUT OIL OIL
1.0000 "application " | TOPICAL_OIL | Status: DC | PRN
  Administered 2019-04-01: 1 via TOPICAL
  Filled 2019-03-31: qty 120

## 2019-03-31 MED ORDER — KETOROLAC TROMETHAMINE 30 MG/ML IJ SOLN
30.0000 mg | Freq: Four times a day (QID) | INTRAMUSCULAR | Status: AC
  Administered 2019-03-31 – 2019-04-01 (×2): 30 mg via INTRAVENOUS
  Filled 2019-03-31 (×2): qty 1

## 2019-03-31 MED ORDER — PRENATAL MULTIVITAMIN CH
1.0000 | ORAL_TABLET | Freq: Every day | ORAL | Status: DC
  Administered 2019-04-01 – 2019-04-02 (×2): 1 via ORAL
  Filled 2019-03-31 (×2): qty 1

## 2019-03-31 MED ORDER — SIMETHICONE 80 MG PO CHEW
80.0000 mg | CHEWABLE_TABLET | ORAL | Status: DC | PRN
  Filled 2019-03-31: qty 1

## 2019-03-31 MED ORDER — LIDOCAINE 5 % EX PTCH
1.0000 | MEDICATED_PATCH | CUTANEOUS | Status: DC
  Administered 2019-04-01 – 2019-04-02 (×2): 1 via TRANSDERMAL
  Filled 2019-03-31 (×2): qty 1

## 2019-03-31 MED ORDER — CEFAZOLIN SODIUM-DEXTROSE 2-4 GM/100ML-% IV SOLN
2.0000 g | INTRAVENOUS | Status: AC
  Administered 2019-03-31: 2 g via INTRAVENOUS
  Filled 2019-03-31: qty 100

## 2019-03-31 MED ORDER — NALOXONE HCL 0.4 MG/ML IJ SOLN: 0.4000 mg | INTRAMUSCULAR | Status: DC | PRN

## 2019-03-31 MED ORDER — ONDANSETRON HCL 4 MG/2ML IJ SOLN
INTRAMUSCULAR | Status: DC | PRN
  Administered 2019-03-31: 4 mg via INTRAVENOUS

## 2019-03-31 MED ORDER — MORPHINE SULFATE (PF) 0.5 MG/ML IJ SOLN
INTRAMUSCULAR | Status: AC
  Filled 2019-03-31: qty 10

## 2019-03-31 MED ORDER — EPHEDRINE SULFATE 50 MG/ML IJ SOLN
INTRAMUSCULAR | Status: AC
  Filled 2019-03-31: qty 1

## 2019-03-31 MED ORDER — HYDROMORPHONE HCL 1 MG/ML IJ SOLN: 1.0000 mg | INTRAMUSCULAR | Status: DC | PRN

## 2019-03-31 MED ORDER — DIBUCAINE (PERIANAL) 1 % EX OINT: 1.0000 "application " | TOPICAL_OINTMENT | CUTANEOUS | Status: DC | PRN

## 2019-03-31 MED ORDER — SENNOSIDES-DOCUSATE SODIUM 8.6-50 MG PO TABS
2.0000 | ORAL_TABLET | ORAL | Status: DC
  Administered 2019-04-01 – 2019-04-02 (×2): 2 via ORAL
  Filled 2019-03-31 (×2): qty 2

## 2019-03-31 MED ORDER — FENTANYL CITRATE (PF) 100 MCG/2ML IJ SOLN
INTRAMUSCULAR | Status: DC | PRN
  Administered 2019-03-31: 15 ug via INTRAVENOUS

## 2019-03-31 MED ORDER — PHENYLEPHRINE HCL (PRESSORS) 10 MG/ML IV SOLN
INTRAVENOUS | Status: AC
  Filled 2019-03-31: qty 1

## 2019-03-31 MED ORDER — IBUPROFEN 800 MG PO TABS
800.0000 mg | ORAL_TABLET | Freq: Four times a day (QID) | ORAL | Status: DC
  Administered 2019-04-01 – 2019-04-02 (×3): 800 mg via ORAL
  Filled 2019-03-31 (×3): qty 1

## 2019-03-31 MED ORDER — OXYCODONE-ACETAMINOPHEN 5-325 MG PO TABS
1.0000 | ORAL_TABLET | ORAL | Status: DC | PRN
  Administered 2019-04-01 – 2019-04-02 (×7): 1 via ORAL
  Filled 2019-03-31 (×7): qty 1

## 2019-03-31 MED ORDER — ONDANSETRON HCL 4 MG/2ML IJ SOLN
INTRAMUSCULAR | Status: AC
  Filled 2019-03-31: qty 2

## 2019-03-31 MED ORDER — LACTATED RINGERS IV SOLN
INTRAVENOUS | Status: DC
  Administered 2019-03-31: 09:00:00 via INTRAVENOUS

## 2019-03-31 MED ORDER — EPHEDRINE SULFATE-NACL 50-0.9 MG/10ML-% IV SOSY
PREFILLED_SYRINGE | INTRAVENOUS | Status: DC | PRN
  Administered 2019-03-31 (×2): 10 mg via INTRAVENOUS

## 2019-03-31 MED ORDER — GABAPENTIN 300 MG PO CAPS
300.0000 mg | ORAL_CAPSULE | Freq: Two times a day (BID) | ORAL | Status: DC
  Administered 2019-03-31 – 2019-04-02 (×5): 300 mg via ORAL
  Filled 2019-03-31 (×5): qty 1

## 2019-03-31 MED ORDER — OXYTOCIN 40 UNITS IN NORMAL SALINE INFUSION - SIMPLE MED
INTRAVENOUS | Status: DC | PRN
  Administered 2019-03-31: 300 mL via INTRAVENOUS

## 2019-03-31 MED ORDER — OXYTOCIN 40 UNITS IN NORMAL SALINE INFUSION - SIMPLE MED
2.5000 [IU]/h | INTRAVENOUS | Status: AC
  Filled 2019-03-31: qty 1000

## 2019-03-31 MED ORDER — ZOLPIDEM TARTRATE 5 MG PO TABS: 5.0000 mg | ORAL_TABLET | Freq: Every evening | ORAL | Status: DC | PRN

## 2019-03-31 MED ORDER — OXYCODONE HCL 5 MG PO TABS: 5.0000 mg | ORAL_TABLET | Freq: Four times a day (QID) | ORAL | Status: AC | PRN

## 2019-03-31 MED ORDER — LACTATED RINGERS IV SOLN: INTRAVENOUS | Status: DC

## 2019-03-31 MED ORDER — DIPHENHYDRAMINE HCL 25 MG PO CAPS: 25.0000 mg | ORAL_CAPSULE | ORAL | Status: DC | PRN

## 2019-03-31 MED ORDER — SODIUM CHLORIDE 0.9% FLUSH: 3.0000 mL | INTRAVENOUS | Status: DC | PRN

## 2019-03-31 MED ORDER — WITCH HAZEL-GLYCERIN EX PADS: 1.0000 "application " | MEDICATED_PAD | CUTANEOUS | Status: DC | PRN

## 2019-03-31 MED ORDER — OXYCODONE-ACETAMINOPHEN 5-325 MG PO TABS: 2.0000 | ORAL_TABLET | ORAL | Status: DC | PRN

## 2019-03-31 MED ORDER — FENTANYL CITRATE (PF) 100 MCG/2ML IJ SOLN
INTRAMUSCULAR | Status: AC
  Filled 2019-03-31: qty 2

## 2019-03-31 SURGICAL SUPPLY — 27 items
BAG COUNTER SPONGE EZ (MISCELLANEOUS) ×2 IMPLANT
CANISTER SUCT 3000ML PPV (MISCELLANEOUS) ×3 IMPLANT
CHLORAPREP W/TINT 26 (MISCELLANEOUS) ×6 IMPLANT
COUNTER SPONGE BAG EZ (MISCELLANEOUS) ×1
COVER WAND RF STERILE (DRAPES) ×3 IMPLANT
DRSG TELFA 3X8 NADH (GAUZE/BANDAGES/DRESSINGS) ×3 IMPLANT
ELECT REM PT RETURN 9FT ADLT (ELECTROSURGICAL) ×3
ELECTRODE REM PT RTRN 9FT ADLT (ELECTROSURGICAL) ×1 IMPLANT
EXTRT SYSTEM ALEXIS 17CM (MISCELLANEOUS)
GAUZE SPONGE 4X4 12PLY STRL (GAUZE/BANDAGES/DRESSINGS) ×3 IMPLANT
GLOVE BIO SURGEON STRL SZ 6.5 (GLOVE) ×2 IMPLANT
GLOVE BIO SURGEONS STRL SZ 6.5 (GLOVE) ×1
GLOVE INDICATOR 7.0 STRL GRN (GLOVE) ×3 IMPLANT
GOWN STRL REUS W/ TWL LRG LVL3 (GOWN DISPOSABLE) ×2 IMPLANT
GOWN STRL REUS W/TWL LRG LVL3 (GOWN DISPOSABLE) ×4
KIT TURNOVER KIT A (KITS) ×3 IMPLANT
NS IRRIG 1000ML POUR BTL (IV SOLUTION) ×3 IMPLANT
PACK C SECTION AR (MISCELLANEOUS) ×3 IMPLANT
PAD OB MATERNITY 4.3X12.25 (PERSONAL CARE ITEMS) ×3 IMPLANT
PAD PREP 24X41 OB/GYN DISP (PERSONAL CARE ITEMS) ×3 IMPLANT
PENCIL SMOKE ULTRAEVAC 22 CON (MISCELLANEOUS) ×3 IMPLANT
SUT MNCRL AB 4-0 PS2 18 (SUTURE) ×3 IMPLANT
SUT PLAIN 2 0 XLH (SUTURE) IMPLANT
SUT VIC AB 0 CT1 36 (SUTURE) ×12 IMPLANT
SUT VIC AB 3-0 SH 27 (SUTURE) ×2
SUT VIC AB 3-0 SH 27X BRD (SUTURE) ×1 IMPLANT
SYSTEM CONTND EXTRCTN KII BLLN (MISCELLANEOUS) IMPLANT

## 2019-03-31 NOTE — Lactation Note (Addendum)
This note was copied from a baby's chart. Lactation Consultation Note  Patient Name: Joyce [name] [id number] Date: 03/31/2019 Reason for consult: Follow-up assessment;Mother's request;Term;Other (Comment)(Mom concerned b/c cannot wake baby to breast feed)  When assisted mom in [place] latched effectively as soon as he was put to the breast and had strong rhythmic sucks and swallows.  For this feeding, Lianne Bushy is sleepy and mom was trying to breast feed on left breast without success.  Finally got him to take a few weak sucks but still had shallow latch and lips were not flanged wide enough.  Changed void and put him to right breast.  Demonstrated how to easily hand express and mom has large volumes of colostrum and large nipples.  After several attempts, he pulled in the breast deep enough to sustain latch for 5 to 6 minutes.  Demonstrated how to massage breast and stimulate [name] to keep him awake and actively sucking, but mom reports these sucks were not as strong as he was doing in [place].  [name] had spit large amount of mucous and colostrum after last breast feeding.  Mom tested (+) for MJ 06/24/14 and 03/31/19.  Briefly discussed MJ use as risk to [facility] while breast feeding.  Mom breast fed first baby for 2 months, but reports wanting to breast feed [name] for longer.  Mom did not breast feed last baby stating it was a long labor and then traumatic C/S of a baby weighing more than 10 lbs requiring blood transfusion so was just too stressed out to breast feed.   Mom wanting to order regular diet for first time after just liquids, so put [name] back in crib.  Encouraged mom to put [facility] back to the breast whenever he demonstrated any feeding cues.  Reviewed supply and demand, normal course of lactation and routine newborn feeding patterns.  Lactation name and number written on white board and encouraged to call with any questions, concerns or assistance.     Maternal Data Formula  Feeding for Exclusion: No Has patient been taught Hand Expression?: Yes(Can easily hand express lots of colostrum) Does the patient have breastfeeding experience prior to this delivery?: Yes  Feeding Feeding Type: Breast Fed  LATCH Score Latch: Repeated attempts needed to sustain latch, nipple held in mouth throughout feeding, stimulation needed to elicit sucking reflex.  Audible Swallowing: A few with stimulation  Type of Nipple: Everted at rest and after stimulation(Large nipples)  Comfort (Breast/Nipple): Soft / non-tender  Hold (Positioning): Full assist, staff holds infant at breast  LATCH Score: 6  Interventions Interventions: Assisted with latch;Skin to skin;Breast massage;Hand express;Breast compression;Adjust position;Support pillows;Position options  Lactation Tools Discussed/Used WIC Program: Yes   Consult Status Consult Status: Follow-up Follow-up type: Call as needed    Jarold Motto 03/31/2019, 6:37 PM

## 2019-03-31 NOTE — Transfer of Care (Signed)
Immediate Anesthesia Transfer of Care Note  Patient: Joyce [name]  Procedure(s) Performed: REPEAT CESAREAN SECTION (N/A )  Patient Location: PACU  Anesthesia Type:spinal  Level of Consciousness: awake, alert  and oriented  Airway & Oxygen Therapy: Patient Spontanous Breathing  Post-op Assessment: Report given to RN and Post -op Vital signs reviewed and stable  Post vital signs: Reviewed and stable  Last Vitals:  Vitals Value Taken Time  BP 104/50 03/31/19 1144  Temp 36.6 C 03/31/19 1144  Pulse 84 03/31/19 1144  Resp 23 03/31/19 1144  SpO2 100 % 03/31/19 1144    Last Pain:  Vitals:   03/31/19 1144  TempSrc:   PainSc: 0-No pain         Complications: No apparent anesthesia complications

## 2019-03-31 NOTE — Progress Notes (Signed)
   03/31/19 1000  Clinical Encounter Type  Visited With Patient and family together  Visit Type Initial  Referral From Nurse  Consult/Referral To Chaplain  Spiritual Encounters  Spiritual Needs Prayer;Emotional  Stress Factors  Patient Stress Factors Family relationships  Chaplain visit patient and patient sister was there with her. Patient shared her concerns of baby father with Chaplain. Chaplain gave encouraging words and share her own story. Patient was excited to hear Chaplain story, it gave her hope for her future relationship. Chaplain prayed with patient.

## 2019-03-31 NOTE — Anesthesia Procedure Notes (Signed)
Spinal  Patient location during procedure: OR Start time: 03/31/2019 10:25 AM End time: 03/31/2019 10:29 AM Staffing Anesthesiologist: Piscitello, Precious Haws, MD Resident/CRNA: Eben Burow, CRNA Performed: resident/CRNA  Preanesthetic Checklist Completed: patient identified, site marked, surgical consent, pre-op evaluation, timeout performed, IV checked, risks and benefits discussed and monitors and equipment checked Spinal Block Patient position: sitting Prep: Betadine and site prepped and draped Patient monitoring: heart rate, continuous pulse ox and blood pressure Approach: midline Location: L3-4 Injection technique: single-shot Needle Needle type: Pencan  Needle gauge: 24 G Needle length: 10 cm Assessment Sensory level: T3

## 2019-03-31 NOTE — Op Note (Signed)
Cesarean Section Procedure Note  Indications: previous C-section x 1,  patient declines vag del attempt  Pre-operative Diagnosis: 39 week 0 day pregnancy, prior C-section x 1 declining TOLAC, anemia of pregnancy.  Post-operative Diagnosis: Same  Surgeon: Rubie Maid, MD  Assistants: Surgical scrub assist  Procedure: Repeat low transverse Cesarean Section  Anesthesia: Spinal anesthesia  Findings: Female infant, cephalic presentation, 0258 grams, with Apgar scores of 9 at one minute and 9 at five minutes. Intact placenta with 3 vessel cord.  Copious clear amniotic fluid at rupture.  The uterine outline, tubes and ovaries appeared normal.   Procedure Details: The patient was seen in the Holding Room. The risks, benefits, complications, treatment options, and expected outcomes were discussed with the patient.  The patient concurred with the proposed plan, giving informed consent.  The site of surgery properly noted/marked. The patient was taken to the Operating Room, identified as Joyce [name] and the procedure verified as C-Section Delivery. A Time Out was held and the above information confirmed.  After induction of anesthesia, the patient was draped and prepped in the usual sterile manner. Anesthesia was tested and noted to be adequate. A Pfannenstiel incision was made and carried down through the subcutaneous tissue to the fascia. Fascial incision was made and extended transversely. The fascia was separated from the underlying rectus tissue superiorly and inferiorly. The peritoneum was identified and entered. Peritoneal incision was extended longitudinally. The surgical assist was able to provide retraction to allow for clear visualization of surgical site. The utero-vesical peritoneal reflection was incised transversely and the bladder flap was bluntly freed from the lower uterine segment. A low transverse uterine incision was made. Delivered from cephalic presentation was a 3430 gram  Female with Apgar scores of 9 at one minute and 9 at five minutes.  The assistant was able to apply adequate fundal pressure to allow for successful delivery of the fetus. After the umbilical cord was clamped and cut cord blood was obtained for evaluation. The placenta was removed intact and appeared normal. The uterus was exteriorized and cleared of all clots and debris. The uterine outline, tubes and ovaries appeared normal.  The uterine incision was closed with running locked sutures of 0-Vicryl.  A second suture of 0-Vicryl was used in an imbricating layer.  Hemostasis was observed. Lavage was carried out until clear. The fascia was then reapproximated with a running suture of 0-Vicryl. The  skin was reapproximated with 4-0 Monocryl.  Instrument, sponge, and needle counts were correct prior the abdominal closure and at the conclusion of the case.   Estimated Blood Loss:  600 ml      Drains: foley catheter to gravity drainage, 30 ml clear urine at end of the procedure  Total IV Fluids:  400 ml  Specimens: None         Implants: None         Complications:  None; patient tolerated the procedure well.         Disposition: PACU - hemodynamically stable.         Condition: stable    Rubie Maid, MD [facility]

## 2019-03-31 NOTE — Anesthesia Post-op Follow-up Note (Signed)
Anesthesia QCDR form completed.        

## 2019-03-31 NOTE — Discharge Instructions (Signed)

## 2019-03-31 NOTE — Anesthesia Preprocedure Evaluation (Signed)
Anesthesia Evaluation  Patient identified by MRN, date of birth, ID band Patient awake    Reviewed: Allergy & Precautions, H&P , NPO status , Patient's Chart, lab work & pertinent test results  History of Anesthesia Complications Negative for: history of anesthetic complications  Airway Mallampati: III  TM Distance: >3 FB Neck ROM: full    Dental  (+) Chipped   Pulmonary neg pulmonary ROS, neg shortness of breath,           Cardiovascular Exercise Tolerance: Good (-) hypertensionnegative cardio ROS       Neuro/Psych    GI/Hepatic negative GI ROS, GERD  Medicated and Controlled,  Endo/Other    Renal/GU   negative genitourinary   Musculoskeletal   Abdominal   Peds  Hematology negative hematology ROS (+)   Anesthesia Other Findings Past Medical History: No date: Anemia     Comment:  HEMATOLOGY PREOP BY DR CHERRY No date: Hidradenitis axillaris  Past Surgical History: No date: CESAREAN SECTION 11/2014: INCISION AND DRAINAGE BREAST ABSCESS; Left No date: OTHER SURGICAL HISTORY; N/A     Comment:  had c section, blood transfusion  BMI    Body Mass Index: 36.09 kg/m      Reproductive/Obstetrics (+) Pregnancy                             Anesthesia Physical Anesthesia Plan  ASA: II  Anesthesia Plan: Spinal   Post-op Pain Management:    Induction:   PONV Risk Score and Plan:   Airway Management Planned: Natural Airway and Nasal Cannula  Additional Equipment:   Intra-op Plan:   Post-operative Plan:   Informed Consent: I have reviewed the patients History and Physical, chart, labs and discussed the procedure including the risks, benefits and alternatives for the proposed anesthesia with the patient or authorized representative who has indicated his/her understanding and acceptance.     Dental Advisory Given  Plan Discussed with: Anesthesiologist, CRNA and  Surgeon  Anesthesia Plan Comments: (Patient reports no bleeding problems and no anticoagulant use.  Plan for spinal with backup GA  Patient consented for risks of anesthesia including but not limited to:  - adverse reactions to medications - risk of bleeding, infection, nerve damage and headache - risk of failed spinal - damage to teeth, lips or other oral mucosa - sore throat or hoarseness - Damage to heart, brain, lungs or loss of life  Patient voiced understanding.)        Anesthesia Quick Evaluation

## 2019-03-31 NOTE — H&P (Signed)
Obstetric Preoperative History and Physical  Joyce [name] is a 28 y.o. S2G3151 with IUP at [redacted]w[redacted]d presenting for presenting for scheduled repeat cesarean section. History of prior c-section x 1 for failure to progress, CPD, fetal macrosomia (9+ lbs).  Initially desired TOLAC, but now declines vaginal attempt.  Patient with significant history of anemia this pregnancy, has recently started iron infusions.   Prenatal Course Source of Care: [facility] with onset of care at 19 weeks Pregnancy complications or risks: Patient Active Problem List   Diagnosis Date Noted  . History of low transverse cesarean section 03/31/2019  . IDA (iron deficiency anemia) 03/18/2019  . History of blood transfusion 03/14/2019  . History of cesarean delivery affecting pregnancy 03/14/2019  . Anemia of pregnancy in third trimester 03/14/2019  . History of macrosomia in infant in prior pregnancy, currently pregnant in third trimester 03/14/2019  . Fall 01/13/2019  . Constipated 01/13/2019  . Late prenatal care 11/18/2018  . Nausea/vomiting in pregnancy 11/18/2018  . Gastroesophageal reflux in pregnancy 11/18/2018  . Hidradenitis axillaris 08/31/2015  . Anemia   . Hidradenitis 12/26/2013   She plans to breastfeed She desires Nexplanon for postpartum contraception.   Prenatal labs and studies: ABO, Rh: --/--/PENDING (06/15 7616) Antibody: PENDING (06/15 0906) Rubella: 1.41 (02/03 1007) RPR: Non Reactive (02/03 1007)  HBsAg: Negative (02/03 1007)  HIV: Non Reactive (02/03 1007)  WVP:XTGGYIRS (05/22 1618) 1 hr Glucola  normal Genetic screening normal Anatomy US normal   Past Medical History:  Diagnosis Date  . Anemia    HEMATOLOGY PREOP BY DR Joyce [name]  . Hidradenitis axillaris     Past Surgical History:  Procedure Laterality Date  . CESAREAN SECTION    . INCISION AND DRAINAGE BREAST ABSCESS Left 11/2014  . OTHER SURGICAL HISTORY N/A    had c section, blood transfusion    OB  History  Gravida Para Term Preterm AB Living  5 2 2  0 2 2  SAB TAB Ectopic Multiple Live Births  1 0 0 0 2    # Outcome Date GA Lbr Len/2nd Weight Sex Delivery Anes PTL Lv  5 Current           4 SAB 06/09/16          3 Term 06/25/14 [redacted]w[redacted]d  4139 g M CS-LTranv   LIV     Complications: Cephalopelvic Disproportion, Failure to Progress in First Stage  2 Term 02/26/08   3345 g M Vag-Spont   LIV  1 AB 10/16/04            Social History   Socioeconomic History  . Marital status: Single    Spouse name: Not on file  . Number of children: Not on file  . Years of education: Not on file  . Highest education level: Not on file  Occupational History  . Not on file  Social Needs  . Financial resource strain: Not on file  . Food insecurity    Worry: Not on file    Inability: Not on file  . Transportation needs    Medical: Not on file    Non-medical: Not on file  Tobacco Use  . Smoking status: Never Smoker  . Smokeless tobacco: Never Used  Substance and Sexual Activity  . Alcohol use: Not Currently    Alcohol/week: 0.0 standard drinks    Comment: occa  . Drug use: Not Currently    Types: Marijuana  . Sexual activity: Not Currently    Birth control/protection:  Other-see comments    Comment: still deciding  Lifestyle  . Physical activity    Days per week: Not on file    Minutes per session: Not on file  . Stress: Not on file  Relationships  . Social Musicianconnections    Talks on phone: Not on file    Gets together: Not on file    Attends religious service: Not on file    Active member of club or organization: Not on file    Attends meetings of clubs or organizations: Not on file    Relationship status: Not on file  Other Topics Concern  . Not on file  Social History Narrative  . Not on file    Family History  Problem Relation Age of Onset  . Migraines Mother   . Stroke Maternal Grandmother   . Cancer Maternal Grandfather 70       throat  . Cancer Paternal Grandmother      Medications Prior to Admission  Medication Sig Dispense Refill Last Dose  . Prenatal-DSS-FeCb-FeGl-FA (CITRANATAL BLOOM) 90-1 MG TABS Take 1 tablet by mouth daily. 30 tablet 4     Allergies  Allergen Reactions  . Sulfa Antibiotics Hives    Review of Systems: Negative except for what is mentioned in HPI.  Physical Exam: BP 120/71 (BP Location: Left Arm)   Pulse 93   Temp 98.5 F (36.9 C) (Oral)   Resp 18   Ht 5\' 1"  (1.549 m)   Wt 86.6 kg   LMP 07/08/2018 (Approximate)   BMI 36.09 kg/m  FHR by Doppler: 135 bpm GENERAL: Well-developed, well-nourished female in no acute distress.  LUNGS: Clear to auscultation bilaterally.  HEART: Regular rate and rhythm. ABDOMEN: Soft, nontender, nondistended, gravid, well-healed Pfannenstiel incision. PELVIC: Deferred EXTREMITIES: Nontender, no edema, 2+ distal pulses.   Pertinent Labs/Studies:   Results for orders placed or performed during the hospital encounter of 03/31/19 (from the past 72 hour(s))  CBC     Status: Abnormal   Collection Time: 03/31/19  9:06 AM  Result Value Ref Range   WBC 11.7 (H) 4.0 - 10.5 K/uL   RBC 3.60 (L) 3.87 - 5.11 MIL/uL   Hemoglobin 9.5 (L) 12.0 - 15.0 g/dL   HCT 16.130.8 (L) 09.636.0 - 04.546.0 %   MCV 85.6 80.0 - 100.0 fL   MCH 26.4 26.0 - 34.0 pg   MCHC 30.8 30.0 - 36.0 g/dL   RDW 40.919.5 (H) 81.111.5 - 91.415.5 %   Platelets 223 150 - 400 K/uL   nRBC 0.0 0.0 - 0.2 %    Comment: Performed at [facility], [address] [place], [place] [address]  Type and screen     Status: None (Preliminary result)   Collection Time: 03/31/19  9:06 AM  Result Value Ref Range   ABO/RH(D) PENDING    Antibody Screen PENDING    Sample Expiration      04/03/2019,2359 Performed at [facility], [address] [place], [place] [address]     Assessment and Plan :Joyce [name] is a 28 y.o. [id number] at 9874w0d being admitted  for scheduled cesarean section delivery . The patient is understanding of the planned  procedure and is aware of and accepting of all surgical risks, including but not limited to: bleeding which may require transfusion or reoperation; infection which may require antibiotics; injury to bowel, bladder, ureters or other surrounding organs which may require repair; injury to the fetus; need for additional procedures including hysterectomy in the  event of life-threatening complications; placental abnormalities wth subsequent pregnancies; incisional problems; blood clot disorders which may require blood thinners;, and other postoperative/anesthesia complications. The patient is in agreement with the proposed plan, and gives informed written consent for the procedure. All questions have been answered.   Hildred Laserherry, Kaelob Persky, MD [facility]

## 2019-04-01 ENCOUNTER — Encounter: Payer: Self-pay | Admitting: Obstetrics and Gynecology

## 2019-04-01 LAB — CBC
HCT: 24.8 % — ABNORMAL LOW (ref 36.0–46.0)
Hemoglobin: 7.8 g/dL — ABNORMAL LOW (ref 12.0–15.0)
MCH: 26.4 pg (ref 26.0–34.0)
MCHC: 31.5 g/dL (ref 30.0–36.0)
MCV: 83.8 fL (ref 80.0–100.0)
Platelets: 201 10*3/uL (ref 150–400)
RBC: 2.96 MIL/uL — ABNORMAL LOW (ref 3.87–5.11)
RDW: 19 % — ABNORMAL HIGH (ref 11.5–15.5)
WBC: 17.2 10*3/uL — ABNORMAL HIGH (ref 4.0–10.5)
nRBC: 0 % (ref 0.0–0.2)

## 2019-04-01 LAB — RPR: RPR Ser Ql: NONREACTIVE

## 2019-04-01 NOTE — Lactation Note (Signed)
This note was copied from a baby's chart. Lactation Consultation Note  Patient Name: Joyce [name] [id number] Date: 04/01/2019     Maternal Data    Feeding    LATCH Score                   Interventions    Lactation Tools Discussed/Used     Consult Status  MOB states that breastfeeding has been going very well since birth and that baby did clusterfeeding last night. LC discussed why clusterfeeding occurs and highlighted I/Os , hunger cues, how to know when baby is full and the importance of establishing a good milk supply.  MOB states that she does not have any questions about breastfeeding at this time and knows where/how to find support after D/C.     Marnee Spring 04/01/2019, 11:33 AM

## 2019-04-01 NOTE — Progress Notes (Signed)
Postpartum Day # 1: Cesarean Delivery (repeat)  Subjective: Patient reports tolerating PO, + flatus and no problems voiding.  Ambulating without difficulty.   Objective: Vital signs in last 24 hours: Temp:  [97.7 F (36.5 C)-98.3 F (36.8 C)] 98.3 F (36.8 C) (06/16 0751) Pulse Rate:  [61-127] 64 (06/16 0751) Resp:  [15-24] 20 (06/16 0751) BP: (87-117)/(50-90) 106/59 (06/16 0751) SpO2:  [96 %-100 %] 100 % (06/16 0751)  Physical Exam:  General: alert and no distress Lungs: clear to auscultation bilaterally Breasts: normal appearance, no masses or tenderness Heart: regular rate and rhythm, S1, S2 normal, no murmur, click, rub or gallop Abdomen: soft, non-tender; bowel sounds normal; no masses,  no organomegaly Pelvis: Lochia appropriate, Uterine Fundus firm, Incision: healing well, no significant drainage, no dehiscence, no significant erythema Extremities: DVT Evaluation: No evidence of DVT seen on physical exam. Negative Homan's sign. No cords or calf tenderness. No significant calf/ankle edema.  Recent Labs    03/31/19 0906 04/01/19 0451  HGB 9.5* 7.8*  HCT 30.8* 24.8*    Assessment/Plan: Status post Cesarean section. Doing well postoperatively.  Breastfeeding, doing well Circumcision after discharge Contraception: Nexplanon Regular diet as tolertaed Continue PO pain management History of anemia, worsened after surgery. Patient asymptomatic.  Has appt with Hematologist next week for next iron infusion.  Can also continue supplementation with PO iron.  Continue current care.  Rubie Maid, MD [facility]

## 2019-04-01 NOTE — Anesthesia Postprocedure Evaluation (Signed)
Anesthesia Post Note  Patient: Joyce [name]  Procedure(s) Performed: REPEAT CESAREAN SECTION (N/A )  Patient location during evaluation: [facility] Anesthesia Type: Spinal Level of consciousness: awake, awake and alert, oriented and patient cooperative Pain management: pain level controlled Vital Signs Assessment: post-procedure vital signs reviewed and stable Respiratory status: spontaneous breathing, nonlabored ventilation and respiratory function stable Cardiovascular status: stable Postop Assessment: no headache, no backache, patient able to bend at knees, no apparent nausea or vomiting, able to ambulate and adequate PO intake Anesthetic complications: no     Last Vitals:  Vitals:   04/01/19 0317 04/01/19 0500  BP: 104/66   Pulse: 66 79  Resp: 18   Temp: 36.6 C   SpO2: 100% 98%    Last Pain:  Vitals:   04/01/19 0317  TempSrc: Oral  PainSc:                  Ricki Miller

## 2019-04-01 NOTE — Anesthesia Post-op Follow-up Note (Signed)
  Anesthesia Pain Follow-up Note  Patient: JOLENA KITTLE  Day # 1  Date of Follow-up: 04/01/2019 Time: 7:49 AM  Last Vitals:  Vitals:   04/01/19 0317 04/01/19 0500  BP: 104/66   Pulse: 66 79  Resp: 18   Temp: 36.6 C   SpO2: 100% 98%    Level of Consciousness: alert  Pain: mild   Side Effects:None  Catheter Site Exam:clean, dry     Plan: D/C from anesthesia care at surgeon's request  Ricki Miller

## 2019-04-02 MED ORDER — IBUPROFEN 800 MG PO TABS: 800.0000 mg | ORAL_TABLET | Freq: Four times a day (QID) | ORAL | 0 refills | Status: AC

## 2019-04-02 MED ORDER — OXYCODONE-ACETAMINOPHEN 5-325 MG PO TABS: 1.0000 | ORAL_TABLET | ORAL | 0 refills | Status: AC | PRN

## 2019-04-02 NOTE — Final Progress Note (Addendum)
             Discharge Day SOAP Note:  Subjective:  The patient has no complaints.  She is ambulating well. She is taking PO well. Pain is well controlled with current medications. Patient is urinating without difficulty.   She is passing flatus.    Objective  Vital signs in last 24 hours: BP 101/71 (BP Location: Left Arm)   Pulse 70   Temp (!) 97.5 F (36.4 C) (Oral)   Resp 18   Ht 5\' 1"  (1.549 m)   Wt 86.6 kg   LMP 07/08/2018 (Approximate)   SpO2 100%   Breastfeeding Unknown   BMI 36.09 kg/m   Physical Exam: Gen: NAD Abdomen:  clean, dry, no drainage Fundus Fundal Tone: Firm  Lochia Amount: Small     Data Review Labs: CBC Latest Ref Rng & Units 04/01/2019 03/31/2019 03/19/2019  WBC 4.0 - 10.5 K/uL 17.2(H) 11.7(H) 11.1(H)  Hemoglobin 12.0 - 15.0 g/dL 7.8(L) 9.5(L) 8.6(L)  Hematocrit 36.0 - 46.0 % 24.8(L) 30.8(L) 27.4(L)  Platelets 150 - 400 K/uL 201 223 215   AB POS  Assessment:  Active Problems:   History of blood transfusion   History of macrosomia in infant in prior pregnancy, currently pregnant in third trimester   IDA (iron deficiency anemia)   History of low transverse cesarean section   Doing well.  Normal progress as expected.    Plan:  Discharge to home  Modified rest as directed - may slowly resume normal activities with restrictions  as discussed.  Medications as written.  See below for additional.       Discharge Instructions: Per After Visit Summary. Activity: Advance as tolerated. Pelvic rest for 6 weeks.  Also refer to After Visit Summary.  Wound care discussed. Diet: Regular Medications: Allergies as of 04/02/2019      Reactions   Sulfa Antibiotics Hives      Medication List    TAKE these medications   CitraNatal Bloom 90-1 MG Tabs Take 1 tablet by mouth daily.   ibuprofen 800 MG tablet Commonly known as: ADVIL Take 1 tablet (800 mg total) by mouth every 6 (six) hours.   oxyCODONE-acetaminophen 5-325 MG tablet Commonly known  as: PERCOCET/ROXICET Take 1-2 tablets by mouth every 4 (four) hours as needed for moderate pain.      Outpatient follow up: 1 wk for incision check  Postpartum contraception: Nexplanon   Discharged Condition: good  Discharged to: home  Newborn Data: Disposition:home with mother  Apgars: APGAR (1 MIN): 9   APGAR (5 MINS): 9   APGAR (10 MINS):    Baby Feeding: Breast  Philip Aspen, CNM 04/02/2019 4:34 PM

## 2019-04-02 NOTE — Discharge Summary (Signed)
  Physician Obstetric Discharge Summary  Patient ID: Joyce [name] MRN: [id number] DOB/AGE: 16-Jan-1991 28 y.o.-Apr-1992 28 y.o.   Date of Admission: 03/31/2019  Date of Discharge: 04/02/2019  Admitting Diagnosis: Scheduled cesarean section at [redacted]w[redacted]d  Mode of Delivery:     low uterine, transverse     Discharge Diagnosis: No other diagnosis   Intrapartum Procedures: none   Post partum procedures: none  Complications: none                        Discharge Day SOAP Note:  Subjective:  The patient has no complaints.  She is ambulating well. She is taking PO well. Pain is well controlled with current medications. Patient is urinating without difficulty.   She is passing flatus.    Objective  Vital signs in last 24 hours: BP 101/71 (BP Location: Left Arm)   Pulse 70   Temp (!) 97.5 F (36.4 C) (Oral)   Resp 18   Ht 5\' 1"  (1.549 m)   Wt 86.6 kg   LMP 07/08/2018 (Approximate)   SpO2 100%   Breastfeeding Unknown   BMI 36.09 kg/m   Physical Exam: Gen: NAD Abdomen:  clean, dry, no drainage Fundus Fundal Tone: Firm  Lochia Amount: Small     Data Review Labs: CBC Latest Ref Rng & Units 04/01/2019 03/31/2019 03/19/2019  WBC 4.0 - 10.5 K/uL 17.2(H) 11.7(H) 11.1(H)  Hemoglobin 12.0 - 15.0 g/dL 7.8(L) 9.5(L) 8.6(L)  Hematocrit 36.0 - 46.0 % 24.8(L) 30.8(L) 27.4(L)  Platelets 150 - 400 K/uL 201 223 215   AB POS  Assessment:  Active Problems:   History of blood transfusion   History of macrosomia in infant in prior pregnancy, currently pregnant in third trimester   IDA (iron deficiency anemia)   History of low transverse cesarean section   Doing well.  Normal progress as expected.    Plan:  Discharge to home  Modified rest as directed - may slowly resume normal activities with restrictions  as discussed.  Medications as written.  See below for additional.       Discharge Instructions: Per After Visit Summary. Activity: Advance as tolerated. Pelvic rest for 6 weeks.  Also  refer to After Visit Summary.  Wound care discussed. Diet: Regular Medications: Allergies as of 04/02/2019      Reactions   Sulfa Antibiotics Hives      Medication List    TAKE these medications   CitraNatal Bloom 90-1 MG Tabs Take 1 tablet by mouth daily.   ibuprofen 800 MG tablet Commonly known as: ADVIL Take 1 tablet (800 mg total) by mouth every 6 (six) hours.   oxyCODONE-acetaminophen 5-325 MG tablet Commonly known as: PERCOCET/ROXICET Take 1-2 tablets by mouth every 4 (four) hours as needed for moderate pain.      Outpatient follow up: 1 wk for incision check  Postpartum contraception: Nexplanon   Discharged Condition: good  Discharged to: home  Newborn Data: Disposition:home with mother  Apgars: APGAR (1 MIN): 9   APGAR (5 MINS): 9   APGAR (10 MINS):    Baby Feeding: Breast  Philip Aspen, CNM 04/02/2019 4:34 PM

## 2019-04-02 NOTE — Progress Notes (Signed)
Patient discharged home with infant. Discharge instructions, prescriptions and follow up appointment given to and reviewed with patient. Patient verbalized understanding. Pt wheeled out with infant by RN 

## 2019-04-02 NOTE — Clinical Social Work Maternal (Signed)
RNCM to bedside for assessment and CSW consult for depression and positive marajuana.  Patient states she lives alone with her 29yr old and 71yr old children.  Her mother lives close by and is planning on staying with her for a couple of weeks.  She states she has a good support system in place.  FOB is involved with children and shares custody.  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  Her MD put her on zoloft which seemed to help her.  She states she does not have any feelings of sadness, self harm or harming others.  She states she is bonding with her baby.  Baby is in mom's arms.  She is aware to call her doctor if she begins to have any of these feelings.  APS report filed as mother was positive for marajuana.  Patient is aware that it is protocol to report. She states she would only use a few times if she was having trouble sleeping or unable to eat.  Mother and baby should discharge today to home.

## 2019-04-04 ENCOUNTER — Other Ambulatory Visit: Payer: Self-pay

## 2019-04-07 ENCOUNTER — Inpatient Hospital Stay: Payer: Medicaid Other

## 2019-04-11 ENCOUNTER — Telehealth: Payer: Self-pay | Admitting: *Deleted

## 2019-04-11 NOTE — Telephone Encounter (Signed)
Patient answered No to all COVID19 pre screening questions.

## 2019-04-14 ENCOUNTER — Inpatient Hospital Stay: Payer: Medicaid Other

## 2019-05-12 ENCOUNTER — Inpatient Hospital Stay: Payer: Medicaid Other

## 2019-05-12 ENCOUNTER — Telehealth: Payer: Self-pay | Admitting: *Deleted

## 2019-05-12 NOTE — Telephone Encounter (Signed)
Per patient request to cancel her 05/13/19 MD/+/- Venofer appts due to her missing her scheduled 05/12/19 lab appt.  She stated that she will call back at a later date to R/S.

## 2019-05-13 ENCOUNTER — Inpatient Hospital Stay: Payer: Medicaid Other | Admitting: Oncology

## 2019-05-13 ENCOUNTER — Inpatient Hospital Stay: Payer: Medicaid Other

## 2019-10-13 IMAGING — US US OB COMP LESS 14 WK
1 series · 14 of 28 positions shown · non-contrast
Comparison: Pelvic ultrasound 06/10/2016

CLINICAL DATA: Vaginal bleeding for 4 hours.

EXAM:
OBSTETRIC <14 WK ULTRASOUND
TECHNIQUE: Transabdominal ultrasound was performed for evaluation of the
gestation as well as the maternal uterus and adnexal regions.

[Series 1: us ob comp less 14 wk · 41 acquisitions, 14 frames shown]
[im 2/41]
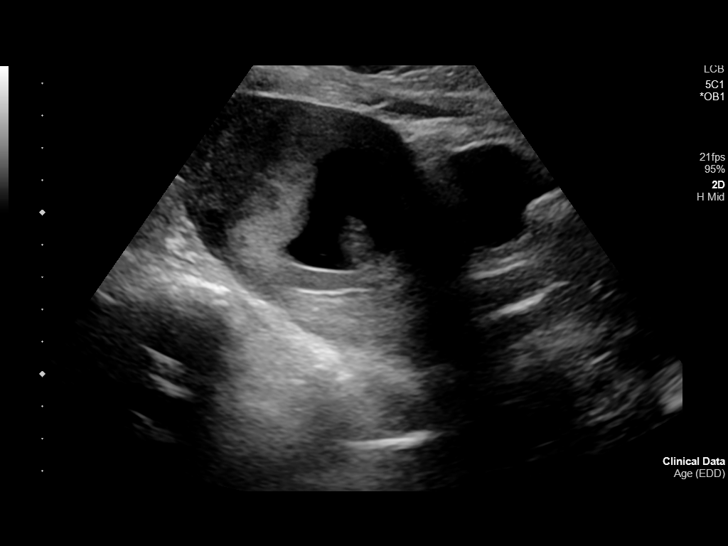
[im 5/41]
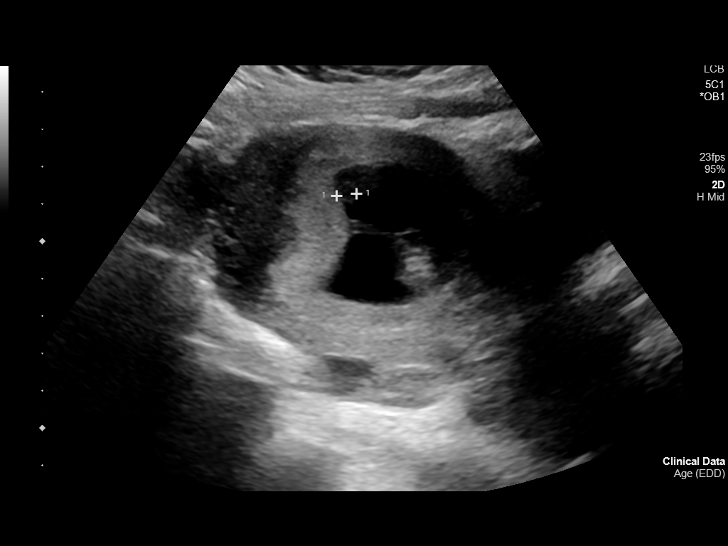
[im 8/41]
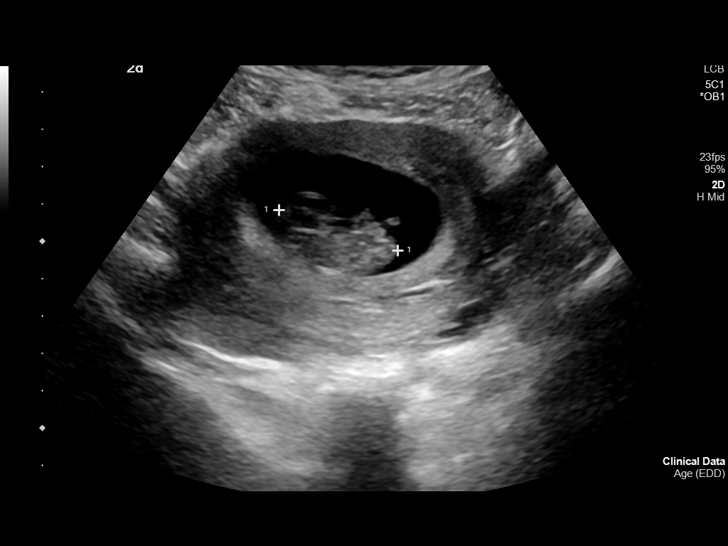
[im 11/41]
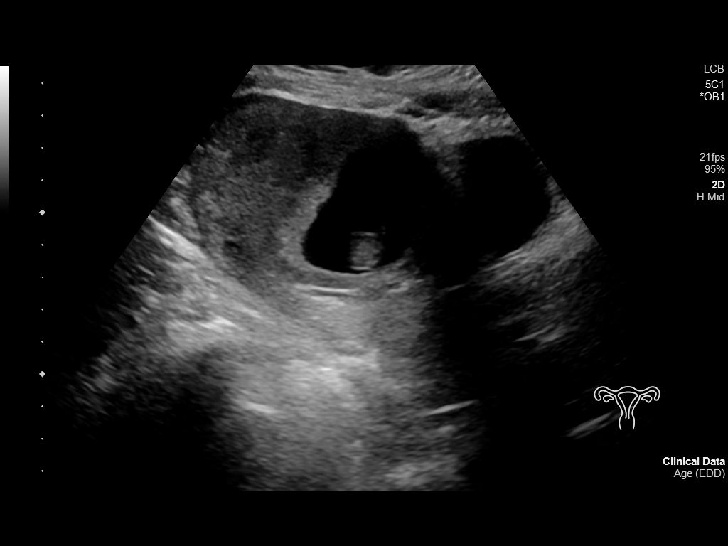
[im 14/41]
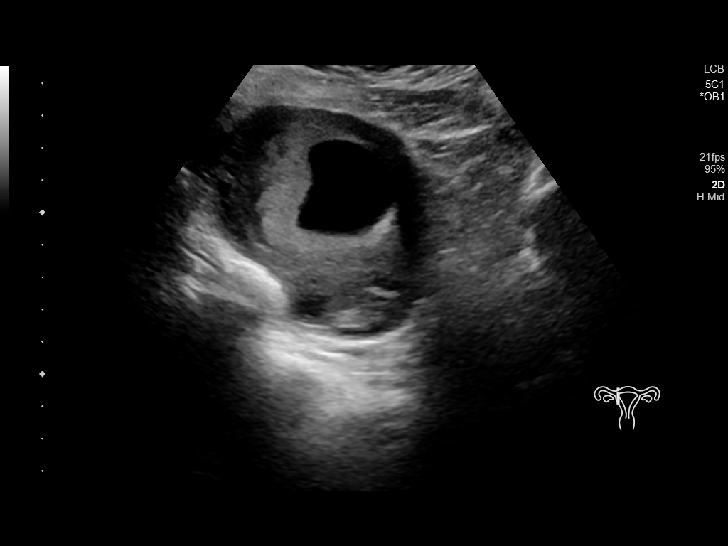
[im 17/41]
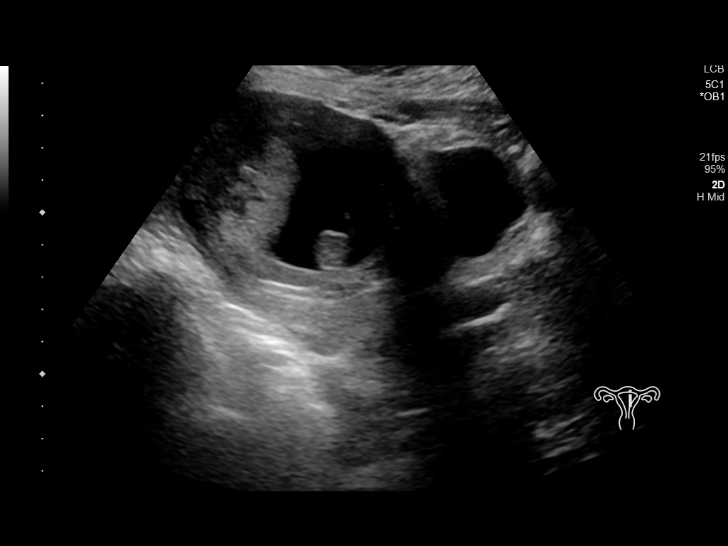
[im 20/41]
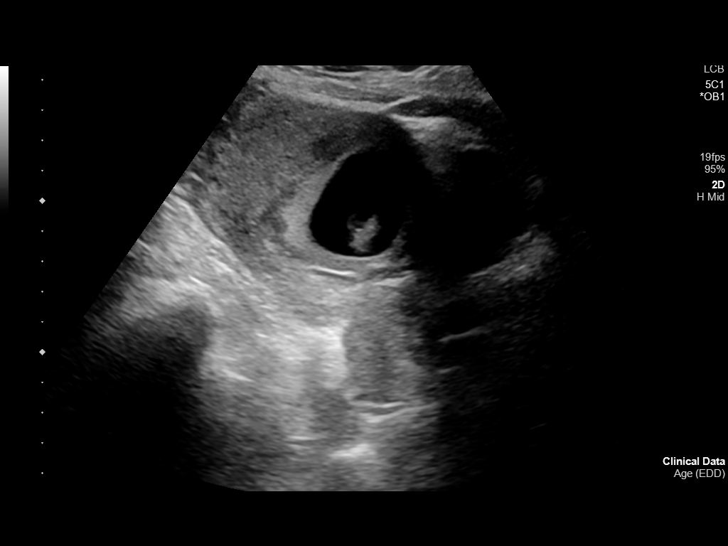
[im 23/41]
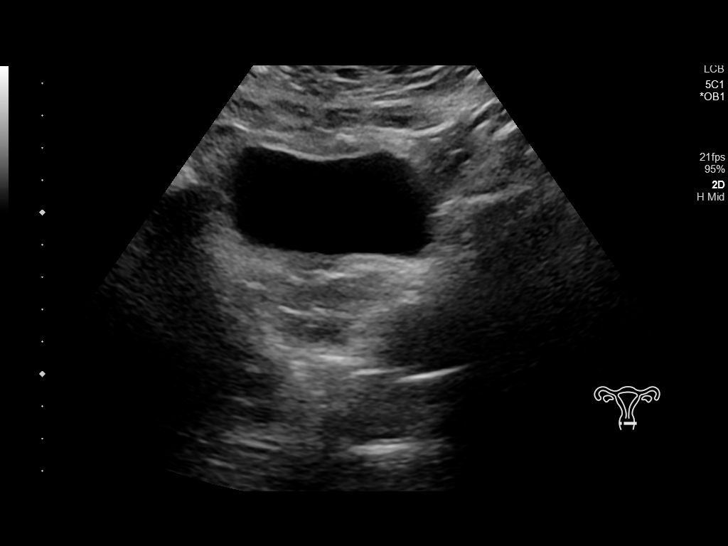
[im 26/41]
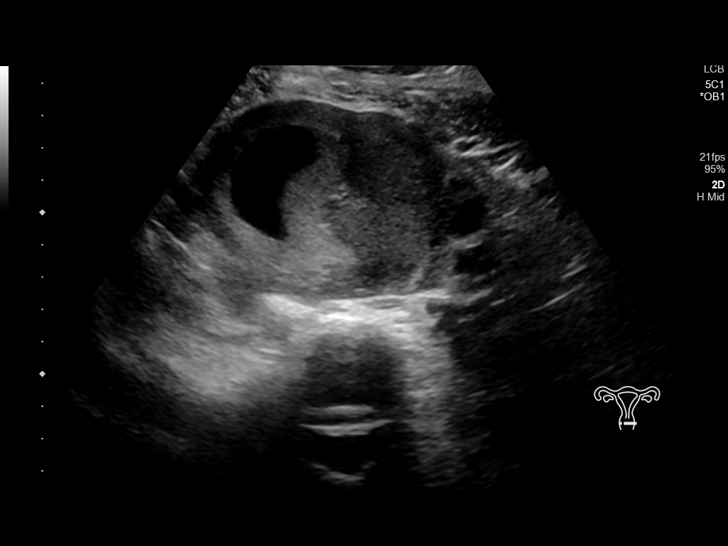
[im 29/41]
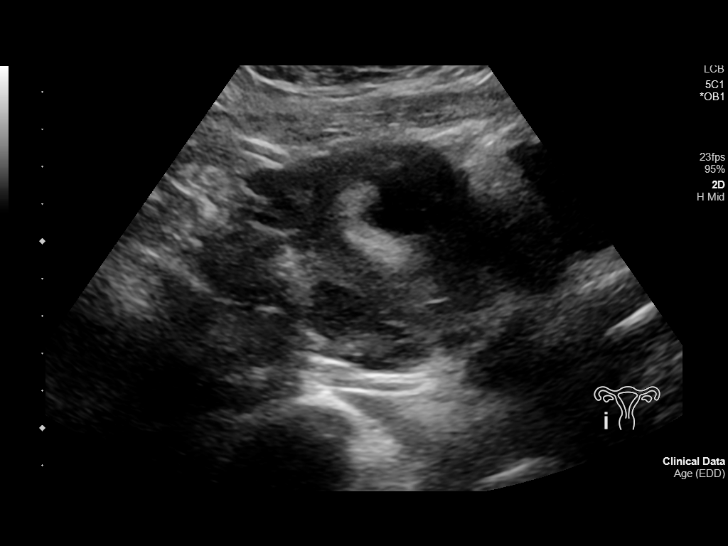
[im 32/41]
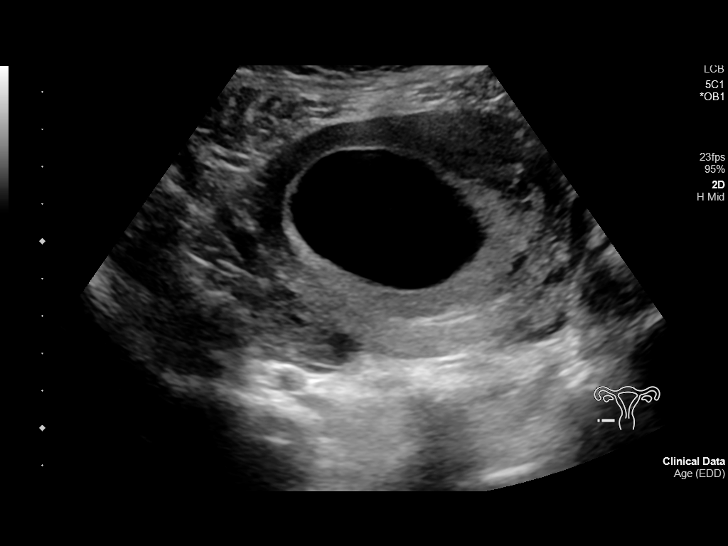
[im 35/41]
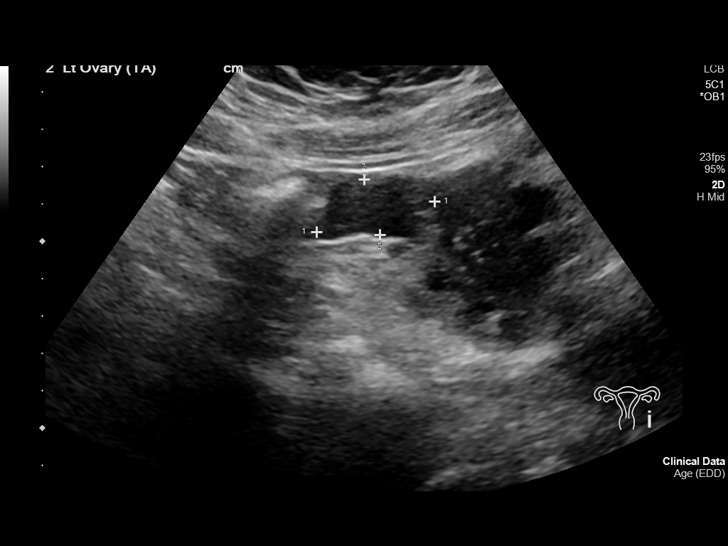
[im 38/41]
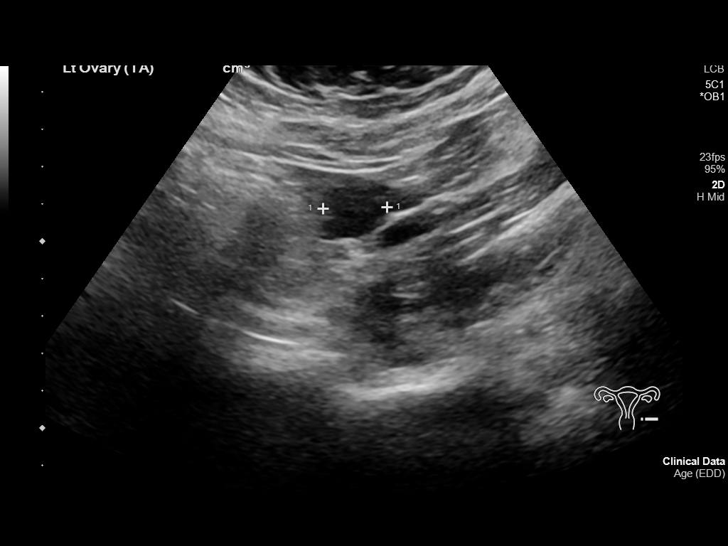
[im 41/41]
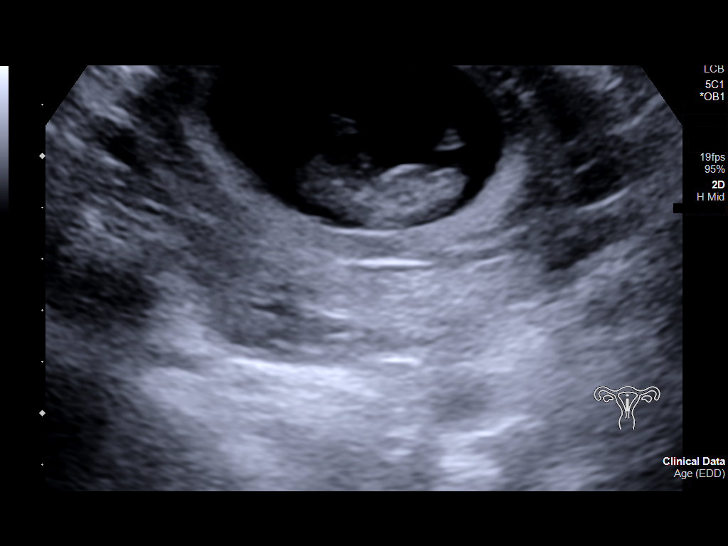

[14 of 28 positions shown; findings below may reference images not displayed]

FINDINGS: Intrauterine gestational sac: Single

Yolk sac:  Visualized.

Embryo:  Visualized.

Cardiac Activity: Visualized.

Heart Rate: 169 bpm

CRL: 33.9 mm   10 w 2 d                  US EDC: 04/10/2019

Subchorionic hemorrhage:  None visualized.

Maternal uterus/adnexae: Normal ovaries.
IMPRESSION: Single live intrauterine pregnancy corresponding to 10 weeks in 2
days gestation.

## 2019-12-30 ENCOUNTER — Ambulatory Visit: Payer: Medicaid Other

## 2021-07-05 ENCOUNTER — Encounter: Payer: Self-pay | Admitting: General Surgery

## 2022-12-22 ENCOUNTER — Encounter: Payer: Self-pay | Admitting: Oncology

## 2022-12-25 ENCOUNTER — Ambulatory Visit (LOCAL_COMMUNITY_HEALTH_CENTER): Payer: Medicaid Other

## 2022-12-25 ENCOUNTER — Encounter: Payer: Self-pay | Admitting: Oncology

## 2022-12-25 DIAGNOSIS — Z111 Encounter for screening for respiratory tuberculosis: Secondary | ICD-10-CM

## 2022-12-25 DIAGNOSIS — Z23 Encounter for immunization: Secondary | ICD-10-CM | POA: Diagnosis not present

## 2022-12-25 NOTE — Progress Notes (Signed)
In nurse clinic for PPD placement and immunizations. 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 Client voiced she needed PPD or QFT ,Tdap,Varicella, Covid and Flu vaccines. Updated client's [facility] -had tdap in 2020. So did not need to receive this vaccine today. VIS statements given to client. Copies of [facility] given to client. Advised when needs to return for #2 dose of varicella.Client had an appt scheduled for March 22 canceled d/t does not need tdap. PPD placed and scheduled testing f/u appt for 12/28/22 for PPDR. Pt has reminder card.

## 2022-12-28 ENCOUNTER — Other Ambulatory Visit: Payer: Self-pay

## 2022-12-28 ENCOUNTER — Ambulatory Visit (LOCAL_COMMUNITY_HEALTH_CENTER): Payer: Self-pay

## 2022-12-28 DIAGNOSIS — Z111 Encounter for screening for respiratory tuberculosis: Secondary | ICD-10-CM

## 2022-12-28 LAB — TB SKIN TEST
Induration: 0 mm
TB Skin Test: NEGATIVE

## 2023-01-05 ENCOUNTER — Other Ambulatory Visit: Payer: Medicaid Other

## 2023-05-01 ENCOUNTER — Ambulatory Visit: Payer: Medicaid Other

## 2024-09-22 ENCOUNTER — Encounter: Payer: Self-pay | Admitting: Oncology
